# Patient Record
Sex: Female | Born: 2005 | Race: Black or African American | Hispanic: No | Marital: Single | Smoking: Never smoker
Health system: Southern US, Community
[De-identification: ages and names within clinical notes are randomized; demographics above are authoritative.]

## PROBLEM LIST (undated history)

## (undated) ENCOUNTER — Inpatient Hospital Stay (HOSPITAL_COMMUNITY): Payer: Self-pay

## (undated) DIAGNOSIS — L732 Hidradenitis suppurativa: Secondary | ICD-10-CM

## (undated) DIAGNOSIS — F419 Anxiety disorder, unspecified: Secondary | ICD-10-CM

## (undated) HISTORY — PX: NO PAST SURGERIES: SHX2092

---
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
## 2023-04-02 IMAGING — US US BREAST*L* LIMITED INC AXILLA
1 series · 13 of 13 positions shown · non-contrast
Comparison: None.

CLINICAL DATA: 15-year-old female with skin placed lumps/draining
sores along the medial inframammary fold bilaterally. The patient is
currently under the care of a dermatologist.

EXAM:
ULTRASOUND OF THE BILATERAL BREAST

[Series 1: us breast*left* limited inc axilla · 0.06mm/px · 13 of 13 slices shown]
[im 1/13]
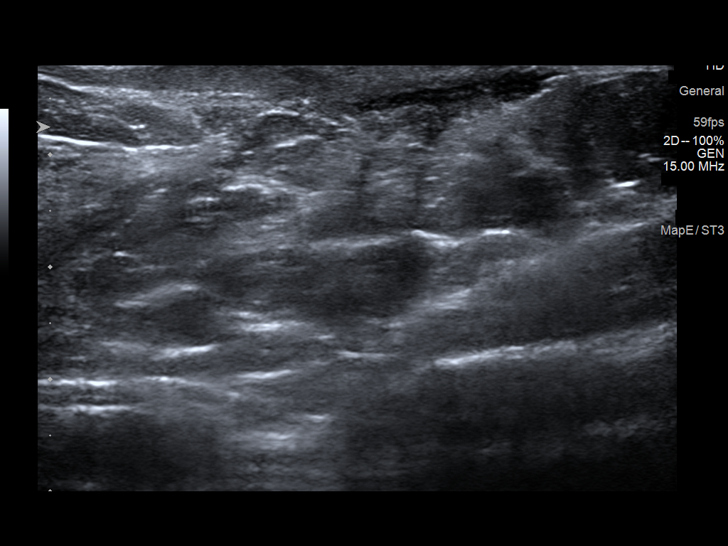
[im 2/13]
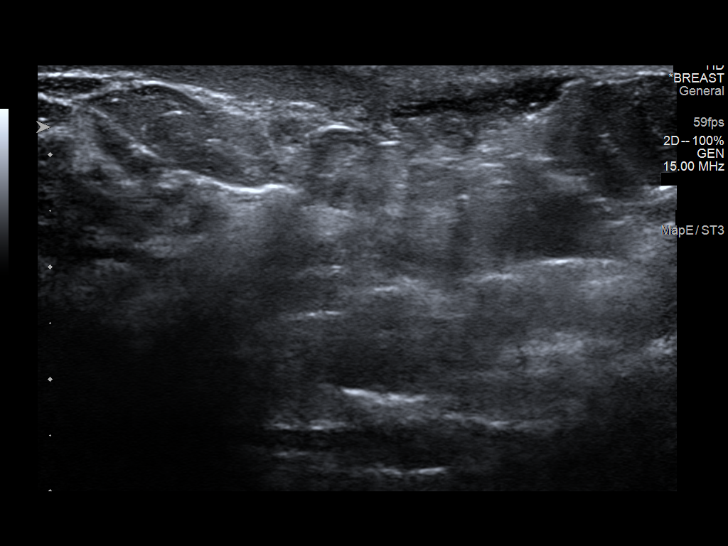
[im 3/13]
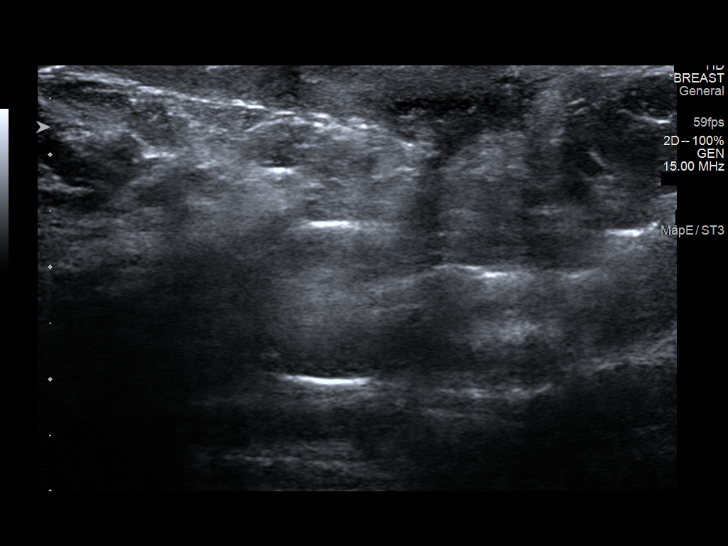
[im 4/13]
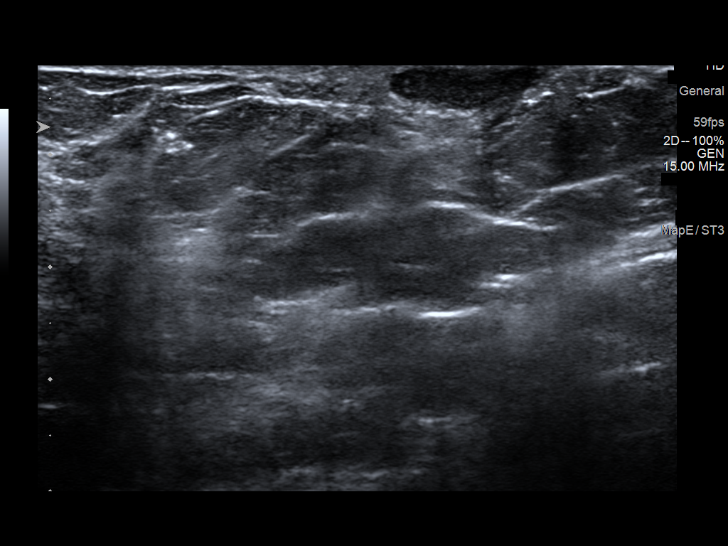
[im 5/13]
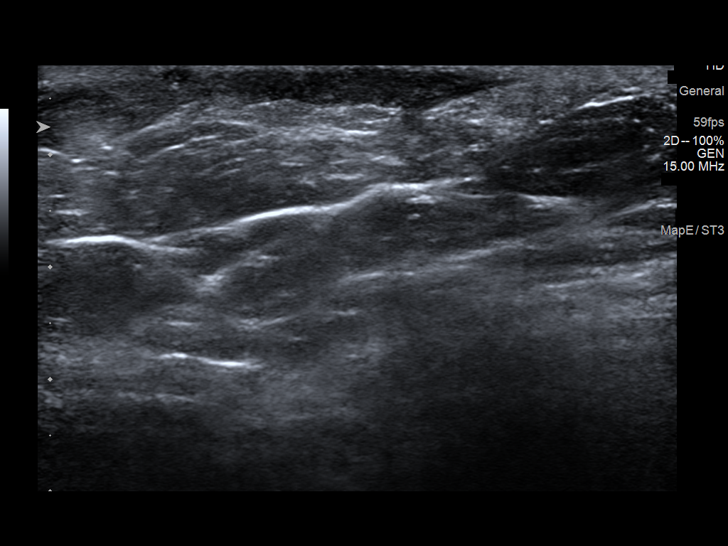
[im 6/13]
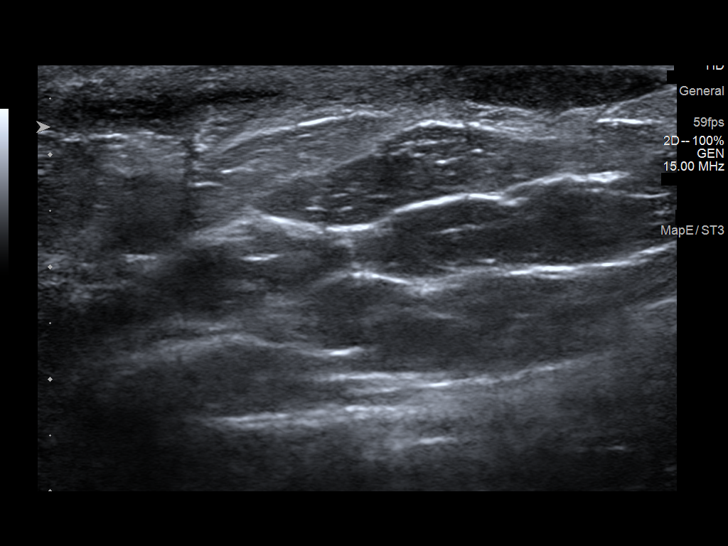
[im 7/13]
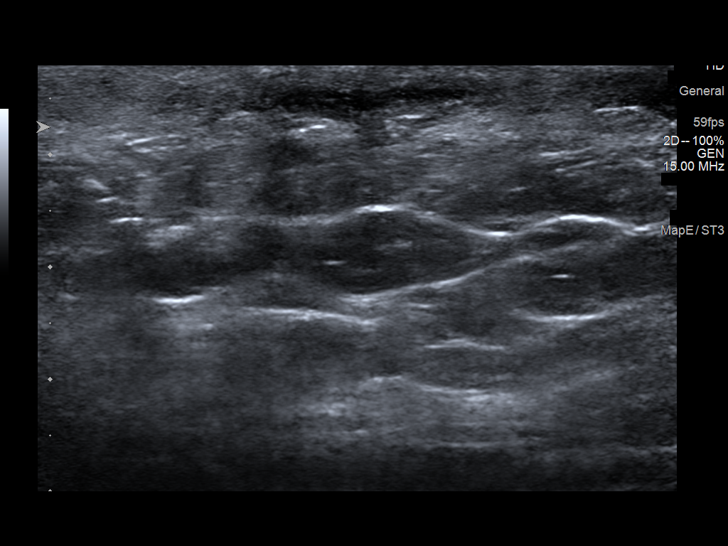
[im 8/13]
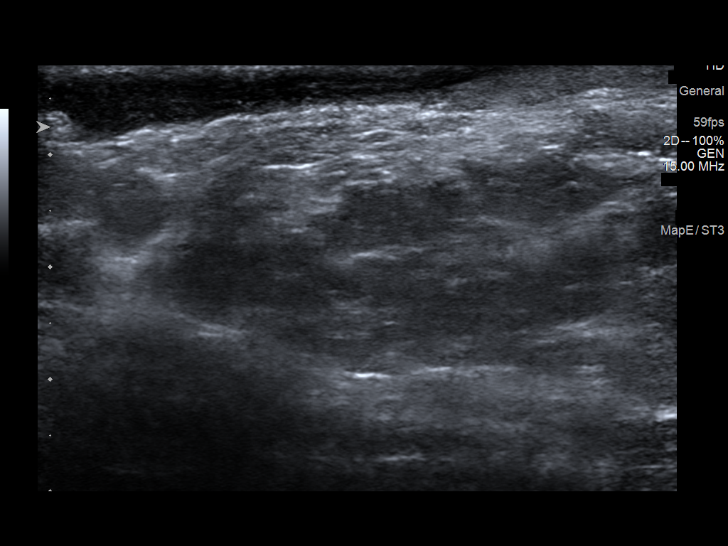
[im 9/13]
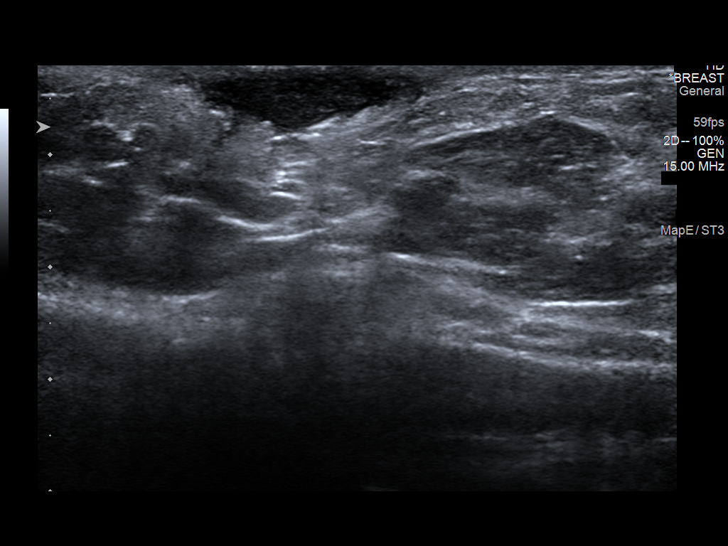
[im 10/13]
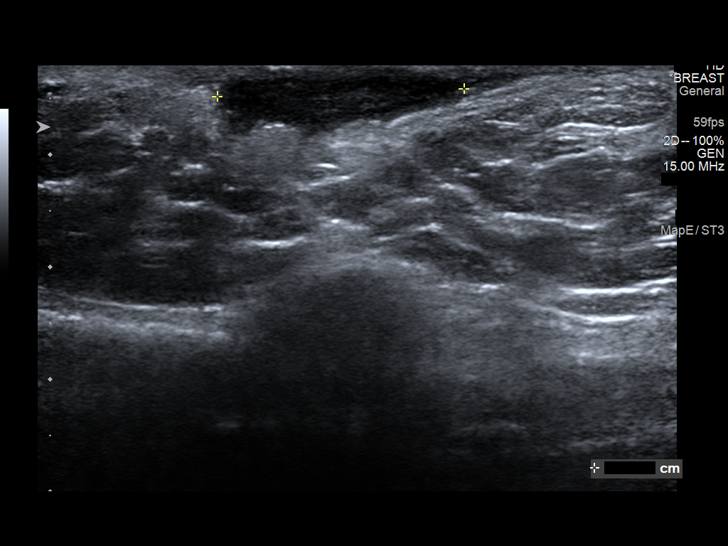
[im 11/13]
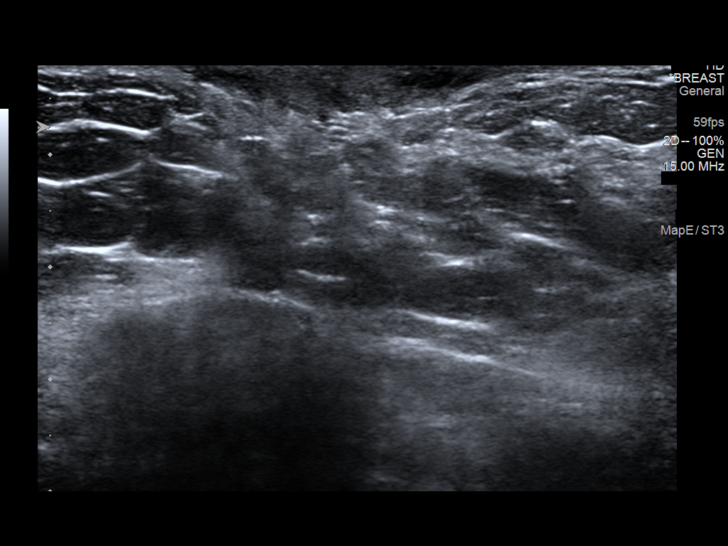
[im 12/13]
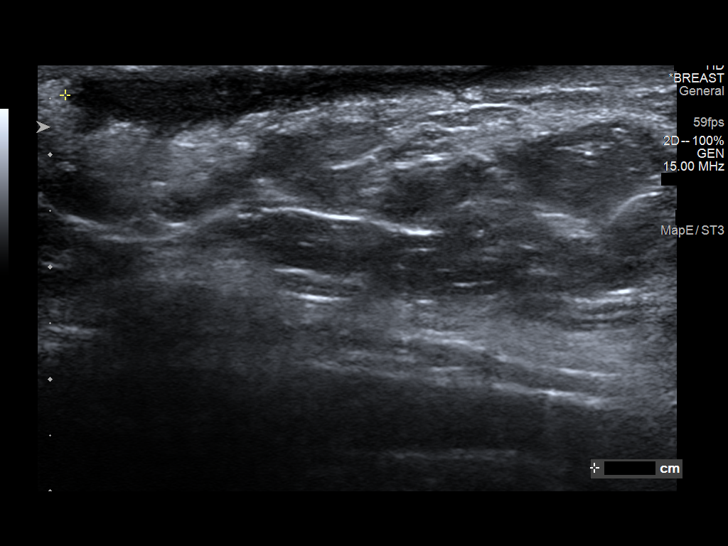
[im 13/13]
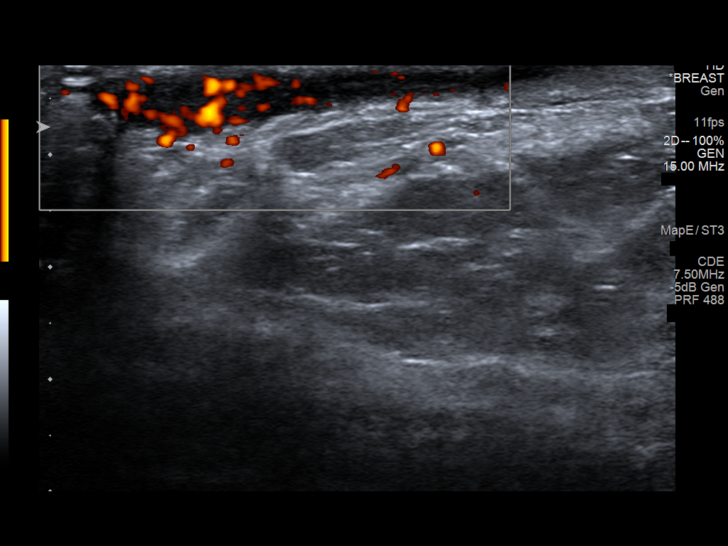

[13 of 13 positions shown; findings below may reference images not displayed]

FINDINGS: On physical exam, open, draining sores are noted along the bilateral
inframammary fold.

Targeted ultrasound is performed, showing circumscribed fluid
collections with associated vascularity along the medial,
inframammary fold bilaterally, right greater than left. These
demonstrate tract to the overlying skin with drainage. No additional
suspicious or deeper findings identified.
IMPRESSION: Bilateral draining fluid/phlegmonous collections within the skin
along the inframammary fold consistent with the patient's clinical
presentation. No additional suspicious or deeper soft tissue
findings identified.

RECOMMENDATION:
1. Recommendation is for clinical and symptomatic follow-up of the
patient's bilateral skin changes.
2. Screening mammogram at age 38 unless there are persistent or
intervening clinical concerns. (Code:UA-B-XJP)

I have discussed the findings and recommendations with the patient.
If applicable, a reminder letter will be sent to the patient
regarding the next appointment.

BI-RADS CATEGORY  2: Benign.

## 2023-07-25 ENCOUNTER — Other Ambulatory Visit: Payer: Self-pay

## 2023-07-25 ENCOUNTER — Encounter (HOSPITAL_COMMUNITY): Payer: Self-pay

## 2023-07-25 ENCOUNTER — Emergency Department (HOSPITAL_COMMUNITY)
Admission: EM | Admit: 2023-07-25 | Discharge: 2023-07-26 | Disposition: A | Discharge status: Other Acute Inpatient Hospital | Payer: Medicaid Other | Attending: Emergency Medicine | Admitting: Emergency Medicine

## 2023-07-25 DIAGNOSIS — T1491XA Suicide attempt, initial encounter: Secondary | ICD-10-CM | POA: Diagnosis not present

## 2023-07-25 DIAGNOSIS — X838XXA Intentional self-harm by other specified means, initial encounter: Secondary | ICD-10-CM | POA: Diagnosis not present

## 2023-07-25 DIAGNOSIS — T50902A Poisoning by unspecified drugs, medicaments and biological substances, intentional self-harm, initial encounter: Secondary | ICD-10-CM

## 2023-07-25 DIAGNOSIS — T40712A Poisoning by cannabis, intentional self-harm, initial encounter: Secondary | ICD-10-CM | POA: Insufficient documentation

## 2023-07-25 DIAGNOSIS — Y900 Blood alcohol level of less than 20 mg/100 ml: Secondary | ICD-10-CM | POA: Insufficient documentation

## 2023-07-25 DIAGNOSIS — F1994 Other psychoactive substance use, unspecified with psychoactive substance-induced mood disorder: Secondary | ICD-10-CM

## 2023-07-25 DIAGNOSIS — F329 Major depressive disorder, single episode, unspecified: Secondary | ICD-10-CM | POA: Insufficient documentation

## 2023-07-25 DIAGNOSIS — T391X2A Poisoning by 4-Aminophenol derivatives, intentional self-harm, initial encounter: Secondary | ICD-10-CM | POA: Diagnosis not present

## 2023-07-25 DIAGNOSIS — F332 Major depressive disorder, recurrent severe without psychotic features: Secondary | ICD-10-CM

## 2023-07-25 LAB — COMPREHENSIVE METABOLIC PANEL
ALT: 17 U/L (ref 0–44)
AST: 18 U/L (ref 15–41)
Albumin: 4.1 g/dL (ref 3.5–5.0)
Alkaline Phosphatase: 82 U/L (ref 38–126)
Anion gap: 7 (ref 5–15)
BUN: 8 mg/dL (ref 6–20)
CO2: 24 mmol/L (ref 22–32)
Calcium: 9.2 mg/dL (ref 8.9–10.3)
Chloride: 105 mmol/L (ref 98–111)
Creatinine, Ser: 0.72 mg/dL (ref 0.44–1.00)
GFR, Estimated: >60 mL/min (ref >60)
Glucose, Bld: 91 mg/dL (ref 70–99)
Potassium: 3.6 mmol/L (ref 3.5–5.1)
Sodium: 136 mmol/L (ref 135–145)
Total Bilirubin: 0.5 mg/dL (ref <1.2)
Total Protein: 8.4 g/dL — ABNORMAL HIGH (ref 6.5–8.1)

## 2023-07-25 LAB — CBC WITH DIFFERENTIAL/PLATELET
Abs Immature Granulocytes: 0.01 10*3/uL (ref 0.00–0.07)
Basophils Absolute: 0 10*3/uL (ref 0.0–0.1)
Basophils Relative: 0 %
Eosinophils Absolute: 0 10*3/uL (ref 0.0–0.5)
Eosinophils Relative: 1 %
HCT: 39.4 % (ref 36.0–46.0)
Hemoglobin: 13.3 g/dL (ref 12.0–15.0)
Immature Granulocytes: 0 %
Lymphocytes Relative: 29 %
Lymphs Abs: 2 10*3/uL (ref 0.7–4.0)
MCH: 29.4 pg (ref 26.0–34.0)
MCHC: 33.8 g/dL (ref 30.0–36.0)
MCV: 87 fL (ref 80.0–100.0)
Monocytes Absolute: 0.4 10*3/uL (ref 0.1–1.0)
Monocytes Relative: 5 %
Neutro Abs: 4.5 10*3/uL (ref 1.7–7.7)
Neutrophils Relative %: 65 %
Platelets: 201 10*3/uL (ref 150–400)
RBC: 4.53 MIL/uL (ref 3.87–5.11)
RDW: 12.6 % (ref 11.5–15.5)
WBC: 7 10*3/uL (ref 4.0–10.5)
nRBC: 0 % (ref 0.0–0.2)

## 2023-07-25 LAB — ETHANOL: Alcohol, Ethyl (B): <10 mg/dL (ref <10)

## 2023-07-25 LAB — ACETAMINOPHEN LEVEL
Acetaminophen (Tylenol), Serum: 52 ug/mL — ABNORMAL HIGH (ref 10–30)
Acetaminophen (Tylenol), Serum: 36 ug/mL — ABNORMAL HIGH (ref 10–30)

## 2023-07-25 LAB — HCG, SERUM, QUALITATIVE: Preg, Serum: NEGATIVE

## 2023-07-25 LAB — SALICYLATE LEVEL: Salicylate Lvl: <7 mg/dL — ABNORMAL LOW (ref 7.0–30.0)

## 2023-07-25 MED ORDER — ONDANSETRON 4 MG PO TBDP: 4.0000 mg | ORAL_TABLET | Freq: Three times a day (TID) | ORAL | Status: DC | PRN

## 2023-07-25 MED ORDER — CHARCOAL ACTIVATED PO LIQD
50.0000 g | Freq: Once | ORAL | Status: AC
  Administered 2023-07-25: 50 g via ORAL
  Filled 2023-07-25: qty 240

## 2023-07-25 NOTE — ED Provider Notes (Signed)
[facility] AT [facility] Provider Note   CSN: [id number] Arrival date & time: 07/25/23  1839     History  Chief Complaint  Patient presents with   Drug Overdose    Sherry Franklin is a 17 y.o. female with no significant past medical history presents to emergency department via EMS for evaluation following a suicide attempt.  She reports that she took 24 500 mg tablets of Tylenol at 1730 today.  She reports that she has had a plan to kill herself for a month.  [place]. She denies HI.   Drug Overdose Pertinent negatives include no chest pain, no abdominal pain, no headaches and no shortness of breath.       Home Medications Prior to Admission medications   Medication Sig Start Date End Date Taking? Authorizing Provider  doxycycline (VIBRAMYCIN) 100 MG capsule Take 1 capsule (100 mg total) by mouth 2 (two) times daily. 10/15/22  Yes Theron Arista, PA-C  naproxen (NAPROSYN) 500 MG tablet Take 1 tablet (500 mg total) by mouth 2 (two) times daily. Patient not taking: Reported on 07/25/2023 06/13/21   Tomi Bamberger, PA-C  ondansetron (ZOFRAN-ODT) 4 MG disintegrating tablet Take 1 tablet (4 mg total) by mouth every 8 (eight) hours as needed for nausea or vomiting. Patient not taking: Reported on 07/25/2023 06/13/21   Tomi Bamberger, PA-C      Allergies    Patient has no known allergies.    Review of Systems   Review of Systems  Constitutional:  Negative for chills, fatigue and fever.  Respiratory:  Negative for cough, chest tightness, shortness of breath and wheezing.   Cardiovascular:  Negative for chest pain and palpitations.  Gastrointestinal:  Negative for abdominal pain, constipation, diarrhea, nausea and vomiting.  Neurological:  Negative for dizziness, seizures, weakness, light-headedness, numbness and headaches.  Psychiatric/Behavioral:  Positive for suicidal ideas.  Negative for hallucinations. The patient is nervous/anxious.     Physical Exam Updated Vital Signs BP 105/76 (BP Location: Right Arm)   Pulse 60   Temp 98.1 F (36.7 C) (Oral)   Resp 14   Ht 5\' 9"  (1.753 m)   Wt 90.7 kg   SpO2 100%   BMI 29.53 kg/m  Physical Exam Vitals and nursing note reviewed.  Constitutional:      General: She is not in acute distress.    Appearance: Normal appearance.  HENT:     Head: Normocephalic and atraumatic.  Eyes:     Conjunctiva/sclera: Conjunctivae normal.  Cardiovascular:     Rate and Rhythm: Normal rate.  Pulmonary:     Effort: Pulmonary effort is normal. No respiratory distress.  Skin:    Coloration: Skin is not jaundiced or pale.  Neurological:     Mental Status: She is alert and oriented to person, place, and time. Mental status is at baseline.     Cranial Nerves: No cranial nerve deficit.     Sensory: No sensory deficit.     Motor: No weakness.     Coordination: Coordination normal.     Gait: Gait normal.     Deep Tendon Reflexes: Reflexes normal.  Psychiatric:     Comments: +SI with a plan Took Tylenol pills today as suicide attempt     ED Results / Procedures / Treatments   Labs (all labs ordered are listed, but only abnormal results are displayed) Labs Reviewed  COMPREHENSIVE METABOLIC PANEL - Abnormal; Notable for the following components:      Result Value   Total Protein 8.4 (*)    All other components within normal limits  ACETAMINOPHEN LEVEL - Abnormal; Notable for the following components:   Acetaminophen (Tylenol), Serum 52 (*)    All other components within normal limits  SALICYLATE LEVEL - Abnormal; Notable for the following components:   Salicylate Lvl <7.0 (*)    All other components within normal limits  ACETAMINOPHEN LEVEL - Abnormal; Notable for the following components:   Acetaminophen (Tylenol), Serum 36 (*)    All other components within normal limits  ETHANOL  CBC WITH DIFFERENTIAL/PLATELET  HCG,  SERUM, QUALITATIVE  RAPID URINE DRUG SCREEN, HOSP PERFORMED    EKG None  Radiology No results found.  Procedures Procedures    Medications Ordered in ED Medications  charcoal activated (NO SORBITOL) (ACTIDOSE-AQUA) suspension 50 g (50 g Oral Given 07/25/23 1937)    ED Course/ Medical Decision Making/ A&P                                 Medical Decision Making Amount and/or Complexity of Data Reviewed Labs: ordered.    Patient presents to the ED for concern of SI and Tylenol ingestion, this involves an extensive number of treatment options, and is a complaint that carries with it a high risk of complications and morbidity.    Co morbidities that complicate the patient evaluation  None   Additional history obtained:  Additional history obtained from EMS, Family, Nursing, and Outside Medical Records   External records from outside source obtained and reviewed    Lab Tests:  I Ordered, and personally interpreted labs.  The pertinent results include:  APAP 2 hours post ingestion is 52. APAP four hours post ingestion 36.     Cardiac Monitoring:  The patient was maintained on a cardiac monitor.  I personally viewed and interpreted the cardiac monitored which showed an underlying rhythm of: NSR with no STE nor ischemia   Medicines ordered and prescription drug management:  I ordered medication including activated charcoal  for tylenol ingestion  Reevaluation of the patient after these medicines showed that the patient improved I have reviewed the patients home medicines and have made adjustments as needed   Consultations Obtained:  I requested consultation with TTS and pending at sign out   Problem List / ED Course:  Suicidal attempt Drug ingestion   Reevaluation:  After the interventions noted above, I reevaluated the patient and found that they have :improved   Dispostion:  Patient resting comfortably in bed. She is tearful and continues to  endorse SI. VS WNL. See HPI.  Upon evaluation, she complains of nausea then had an episode of vomiting a significant amount of pink liquid. She has no complaints of abdominal pain.  Poison control contacted and recommended obtaining APAP level at 4 hours post ingestion and activated charcoal to avoid absorption. APAP 2 hours post ingestion is 52. APAP four hours post ingestion 36. Poison control is contacted and have no further recommendations. TTS consulted for psych management.  TTS pending at sign out to [facility] PA.        Final Clinical Impression(s) / ED Diagnoses Final diagnoses:  Suicide attempt by drug ingestion, initial encounter [facility])    Rx / DC Orders ED Discharge Orders     None  Judithann Sheen, PA 07/25/23 2245    Maia Plan, MD 08/01/23 [phone]

## 2023-07-25 NOTE — ED Triage Notes (Addendum)
Patient BIB [facility] from a car. Ingested 10 grams of tylenol at 5pm. Wanted to kill herself. History of SI attempts. Feeling depressed. Denies pain. Stated "I want to go home." Tearful on arrival.

## 2023-07-25 NOTE — ED Notes (Signed)
Staff gave patient purse to a family member who stated that patient said she can take with her.

## 2023-07-26 ENCOUNTER — Encounter (HOSPITAL_COMMUNITY): Payer: Self-pay | Admitting: Psychiatry

## 2023-07-26 ENCOUNTER — Inpatient Hospital Stay (HOSPITAL_COMMUNITY)
Admission: AD | Admit: 2023-07-26 | Discharge: 2023-07-30 | DRG: 897 | Disposition: A | Discharge status: Home / Self Care | Payer: Medicaid Other | Source: Intra-hospital | Attending: Psychiatry | Admitting: Psychiatry

## 2023-07-26 DIAGNOSIS — F909 Attention-deficit hyperactivity disorder, unspecified type: Secondary | ICD-10-CM | POA: Diagnosis present

## 2023-07-26 DIAGNOSIS — Z818 Family history of other mental and behavioral disorders: Secondary | ICD-10-CM

## 2023-07-26 DIAGNOSIS — Z9151 Personal history of suicidal behavior: Secondary | ICD-10-CM | POA: Diagnosis not present

## 2023-07-26 DIAGNOSIS — F1994 Other psychoactive substance use, unspecified with psychoactive substance-induced mood disorder: Principal | ICD-10-CM | POA: Diagnosis present

## 2023-07-26 DIAGNOSIS — F1290 Cannabis use, unspecified, uncomplicated: Secondary | ICD-10-CM | POA: Diagnosis present

## 2023-07-26 DIAGNOSIS — F332 Major depressive disorder, recurrent severe without psychotic features: Principal | ICD-10-CM | POA: Diagnosis present

## 2023-07-26 DIAGNOSIS — Z91410 Personal history of adult physical and sexual abuse: Secondary | ICD-10-CM

## 2023-07-26 DIAGNOSIS — F411 Generalized anxiety disorder: Secondary | ICD-10-CM | POA: Insufficient documentation

## 2023-07-26 DIAGNOSIS — G4710 Hypersomnia, unspecified: Secondary | ICD-10-CM | POA: Diagnosis present

## 2023-07-26 DIAGNOSIS — Z5941 Food insecurity: Secondary | ICD-10-CM

## 2023-07-26 DIAGNOSIS — F1210 Cannabis abuse, uncomplicated: Secondary | ICD-10-CM | POA: Insufficient documentation

## 2023-07-26 LAB — RAPID URINE DRUG SCREEN, HOSP PERFORMED
Amphetamines: NOT DETECTED
Barbiturates: NOT DETECTED
Benzodiazepines: NOT DETECTED
Cocaine: NOT DETECTED
Opiates: NOT DETECTED
Tetrahydrocannabinol: POSITIVE — AB

## 2023-07-26 MED ORDER — MAGNESIUM HYDROXIDE 400 MG/5ML PO SUSP: 30.0000 mL | Freq: Every day | ORAL | Status: DC | PRN

## 2023-07-26 MED ORDER — TRAZODONE HCL 50 MG PO TABS
50.0000 mg | ORAL_TABLET | Freq: Every evening | ORAL | Status: DC | PRN
  Filled 2023-07-26: qty 1

## 2023-07-26 MED ORDER — HYDROXYZINE HCL 25 MG PO TABS
25.0000 mg | ORAL_TABLET | Freq: Three times a day (TID) | ORAL | Status: DC | PRN
  Filled 2023-07-26: qty 1

## 2023-07-26 MED ORDER — ACETAMINOPHEN 325 MG PO TABS: 650.0000 mg | ORAL_TABLET | Freq: Four times a day (QID) | ORAL | Status: DC | PRN

## 2023-07-26 MED ORDER — HALOPERIDOL 5 MG PO TABS: 5.0000 mg | ORAL_TABLET | Freq: Three times a day (TID) | ORAL | Status: DC | PRN

## 2023-07-26 MED ORDER — ALUM & MAG HYDROXIDE-SIMETH 200-200-20 MG/5ML PO SUSP: 30.0000 mL | ORAL | Status: DC | PRN

## 2023-07-26 MED ORDER — DIPHENHYDRAMINE HCL 25 MG PO CAPS: 50.0000 mg | ORAL_CAPSULE | Freq: Three times a day (TID) | ORAL | Status: DC | PRN

## 2023-07-26 NOTE — Plan of Care (Signed)
  Problem: Education: Goal: Knowledge of [facility] Education information/materials will improve Outcome: Progressing Goal: Verbalization of understanding the information provided will improve Outcome: Progressing   

## 2023-07-26 NOTE — Progress Notes (Signed)
Pt was accepted to [facility] TODAY 07/26/2023; Bed Assignment PENDING discharges   Pt meets inpatient criteria per Mancel Bale, NP   Attending Physician will be Dr. Sarita Bottom, MD   Adult unit: [phone]  Pt can arrive after: [facility] will coordinate with care team.  Care Team notified:  [facility] Zachary George, [facility] [place], Cassandra Hollatz,RN, Chari Manning, Marta Antu, Ashely Johnson,Counselor   [place], [place] 07/26/2023 @ 8:01 AM

## 2023-07-26 NOTE — BH Assessment (Signed)
Comprehensive Clinical Assessment (CCA) Note   07/26/2023 Sherry Franklin [id number]  Disposition: Rockney Ghee, NP recommends inpatient hospitalization.  The patient demonstrates the following risk factors for suicide: Chronic risk factors for suicide include: previous suicide attempts   . Acute risk factors for suicide include: social withdrawal/isolation. Protective factors for this patient include: positive social support. Considering these factors, the overall suicide risk at this point appears to be high. Patient is not appropriate for outpatient follow up.   Per EDP's note: " Pt is a 17 y.o. female with no significant past medical history presents to emergency department via EMS for evaluation following a suicide attempt.  She reports that she took 24 500 mg tablets of Tylenol at 1730 today.  She reports that she has had a plan to kill herself for a month.  [place]. She denies HI. "  Pt was calm and cooperative during the assessment. Pt reports that she has been struggling with depression and suicidal thoughts since August 2024. Pt reports that she became overwhelmed yesterday because she was evicted from her apartment. Pt reports that she has been isolating herself from her family because she wanted to do things on her own. Pt denies HI and AVH. Pt denies outpatient services.Pt is willing to seek outpatient services.   Chief Complaint:  Chief Complaint  Patient presents with   Drug Overdose   Visit Diagnosis:   Suicide Attempt Major Depressive Disorder     CCA Screening, Triage and Referral (STR)  Patient Reported Information How did you hear about [place]? Self  What Is the Reason for Your Visit/Call Today? Per EDP's note: " Pt is a 17 y.o. female with no significant past medical history presents to emergency department via EMS for evaluation following a suicide attempt.  She reports that she took 24 500 mg  tablets of Tylenol at 1730 today.  She reports that she has had a plan to kill herself for a month.  She reports [place] people there. She denies HI. "  How Long Has This Been Causing You Problems? 1-6 months  What Do You Feel Would Help You the Most Today? Treatment for Depression or other mood problem; Stress Management; Medication(s)   Have You Recently Had Any Thoughts About Hurting Yourself? Yes  Are You Planning to Commit Suicide/Harm Yourself At This time? Yes   Flowsheet Row ED from 07/25/2023 in [facility] Emergency Department at [facility] Most recent reading at 07/26/2023  3:31 AM ED from 10/15/2022 in [facility] Emergency Department at [facility] Most recent reading at 10/15/2022  1:37 PM ED from 10/15/2022 in [facility] at [facility]) Most recent reading at 10/15/2022 12:27 PM  C-SSRS RISK CATEGORY High Risk No Risk No Risk       Have you Recently Had Thoughts About Hurting Someone Karolee Ohs? No  Are You Planning to Harm Someone at This Time? No  Explanation: Pt denies HI.   Have You Used Any Alcohol or Drugs in the Past 24 Hours? Yes  What Did You Use and How Much? Marijuana : Unknown amount per pt   Do You Currently Have a Therapist/Psychiatrist? No  Name of Therapist/Psychiatrist: Name of Therapist/Psychiatrist: Denies outpatient services   Have You Been Recently Discharged From Any Office Practice or Programs? No  Explanation of Discharge From Practice/Program:  N/A     CCA Screening Triage Referral Assessment Type of Contact: Tele-Assessment  Telemedicine Service Delivery:   Is this Initial or Reassessment? Is this Initial or Reassessment?: Initial Assessment  Date Telepsych consult ordered in [facility]:  Date Telepsych consult ordered in [facility]: 07/25/23  Time Telepsych consult ordered in [facility]:  Time Telepsych consult ordered in [facility]: 2239  Location of Assessment: [facility]  ED  Provider Location: [facility] Assessment Services   Collateral Involvement: None   Does Patient Have a Automotive engineer Guardian? No  Legal Guardian Contact Information: N/A  Copy of Legal Guardianship Form: -- (N/A)  Legal Guardian Notified of Arrival: -- (N/A)  Legal Guardian Notified of Pending Discharge: -- (N/A)  If Minor and Not Living with Parent(s), Who has Custody? N/A  Is CPS involved or ever been involved? Never  Is APS involved or ever been involved? Never   Patient Determined To Be At Risk for Harm To Self or Others Based on Review of Patient Reported Information or Presenting Complaint? Yes, for Self-Harm  Method: Plan with intent and identified person  Availability of Means: In hand or used  Intent: Clearly intends on inflicting harm that could cause death  Notification Required: No need or identified person  Additional Information for Danger to Others Potential: -- (N/A)  Additional Comments for Danger to Others Potential: N/A  Are There Guns or Other Weapons in Your Home? No  Types of Guns/Weapons: Pt denies access to guns/ weapons  Are These Weapons Safely Secured?                            No  Who Could Verify You Are Able To Have These Secured: Pt denies access to guns/weapons  Do You Have any Outstanding Charges, Pending Court Dates, Parole/Probation? Pt denies pending legal charges  Contacted To Inform of Risk of Harm To Self or Others: -- (n/a)    Does Patient Present under Involuntary Commitment? No    [place] of Residence: [place]   Patient Currently Receiving the Following Services: Not Receiving Services   Determination of Need: Urgent (48 hours)   Options For Referral: Inpatient Hospitalization     CCA Biopsychosocial Patient Reported Schizophrenia/Schizoaffective Diagnosis in Past: No   Strengths: Willingness to seek treatment and self awareness   Mental Health Symptoms Depression:   Change in  energy/activity; Hopelessness; Tearfulness   Duration of Depressive symptoms:  Duration of Depressive Symptoms: Greater than two weeks   Mania:   None   Anxiety:    Sleep; Worrying   Psychosis:   None   Duration of Psychotic symptoms:    Trauma:   None   Obsessions:   None   Compulsions:   None   Inattention:   None   Hyperactivity/Impulsivity:   None   Oppositional/Defiant Behaviors:   None   Emotional Irregularity:   None   Other Mood/Personality Symptoms:   None    Mental Status Exam Appearance and self-care  Stature:   Average   Weight:   Average weight   Clothing:   Casual   Grooming:   Normal   Cosmetic use:   None   Posture/gait:   Normal   Motor activity:   Not Remarkable   Sensorium  Attention:   Normal   Concentration:   Normal   Orientation:   X5   Recall/memory:   Normal   Affect and Mood  Affect:   Depressed  Mood:   Depressed   Relating  Eye contact:   Normal   Facial expression:   Depressed   Attitude toward examiner:   Cooperative   Thought and Language  Speech flow:  Clear and Coherent   Thought content:   Appropriate to Mood and Circumstances   Preoccupation:   None   Hallucinations:   None   Organization:   Coherent   [employer] of Knowledge:   Fair   Intelligence:   Average   Abstraction:   Normal   Judgement:   Good   Reality Testing:   Realistic   Insight:   Good   Decision Making:   Normal   Social Functioning  Social Maturity:   Responsible   Social Judgement:   Normal   Stress  Stressors:   Office manager Ability:   Exhausted; Overwhelmed   Skill Deficits:   Communication; Self-care   Supports:   Family; Friends/Service system     Religion: Religion/Spirituality Are You A Religious Person?: No How Might This Affect Treatment?: n/a  Leisure/Recreation: Leisure / Recreation Do You Have Hobbies?:  No  Exercise/Diet: Exercise/Diet Do You Exercise?: No Have You Gained or Lost A Significant Amount of Weight in the Past Six Months?: No Do You Follow a Special Diet?: No Do You Have Any Trouble Sleeping?: Yes Explanation of Sleeping Difficulties: Pt reports that she is not sleeping alot because of stress   CCA Employment/Education Employment/Work Situation: Employment / Work Situation Employment Situation: Employed Work Stressors: None reported Patient's Job has Been Impacted by Current Illness: No Has Patient ever Been in Equities trader?: No  Education: Education Is Patient Currently Attending School?: No Last Grade Completed: 12 Did You Product manager?: No Did You Have An Individualized Education Program (IIEP): No Did You Have Any Difficulty At [employer]?: No Patient's Education Has Been Impacted by Current Illness: No   CCA Family/Childhood History Family and Relationship History: Family history Marital status: Single Does patient have children?: No  Childhood History:  Childhood History By whom was/is the patient raised?: Mother Did patient suffer any verbal/emotional/physical/sexual abuse as a child?: No Did patient suffer from severe childhood neglect?: No Has patient ever been sexually abused/assaulted/raped as an adolescent or adult?: No Was the patient ever a victim of a crime or a disaster?: No Witnessed domestic violence?: No Has patient been affected by domestic violence as an adult?: No       CCA Substance Use Alcohol/Drug Use: Alcohol / Drug Use Pain Medications: See MAR Prescriptions: See MAR Over the Counter: See MAR History of alcohol / drug use?: Yes Longest period of sobriety (when/how long): N/A Negative Consequences of Use: Financial, Personal relationships Withdrawal Symptoms: None Substance #1 Name of Substance 1: Marijuana 1 - Age of First Use: 16 1 - Amount (size/oz): Per pt, "alot"  Pt reports unknown amount of daily use. 1 -  Frequency: daily 1 - Duration: UTA 1 - Last Use / Amount: Yesterday 07/25/23 1 - Method of Aquiring: UTA 1- Route of Use: UTA                       ASAM's:  Six Dimensions of Multidimensional Assessment  Dimension 1:  Acute Intoxication and/or Withdrawal Potential:      Dimension 2:  Biomedical Conditions and Complications:      Dimension 3:  Emotional, Behavioral, or Cognitive Conditions and Complications:     Dimension 4:  Readiness to Change:  Dimension 5:  Relapse, Continued use, or Continued Problem Potential:     Dimension 6:  Recovery/Living Environment:     ASAM Severity Score:    ASAM Recommended Level of Treatment: ASAM Recommended Level of Treatment: Level I Outpatient Treatment   Substance use Disorder (SUD) Substance Use Disorder (SUD)  Checklist Symptoms of Substance Use: Continued use despite persistent or recurrent social, interpersonal problems, caused or exacerbated by use, Continued use despite having a persistent/recurrent physical/psychological problem caused/exacerbated by use  Recommendations for Services/Supports/Treatments: Recommendations for Services/Supports/Treatments Recommendations For Services/Supports/Treatments: Individual Therapy, Inpatient Hospitalization, Medication Management  Discharge Disposition:    DSM5 Diagnoses: There are no problems to display for this patient.    Referrals to Alternative Service(s): Referred to Alternative Service(s):   Place:   Date:   Time:    Referred to Alternative Service(s):   Place:   Date:   Time:    Referred to Alternative Service(s):   Place:   Date:   Time:    Referred to Alternative Service(s):   Place:   Date:   Time:     Dava Najjar, [place], [facility], NCC
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
## 2023-07-26 NOTE — BHH Group Notes (Signed)
Adult Psychoeducational Group Note  Date:  07/26/2023 Time:  8:27 PM  Group Topic/Focus:  Wrap-Up Group:   The focus of this group is to help patients review their daily goal of treatment and discuss progress on daily workbooks.  Participation Level:  Active  Participation Quality:  Appropriate and Attentive  Affect:  Appropriate  Cognitive:  Alert  Insight: Appropriate  Engagement in Group:  Supportive  Modes of Intervention:  Discussion and Support  Additional Comments:  pt attended AA meeting.  Sherry Franklin 07/26/2023, 8:27 PM

## 2023-07-26 NOTE — Plan of Care (Signed)
  Problem: Education: Goal: Emotional status will improve Outcome: Progressing Goal: Verbalization of understanding the information provided will improve Outcome: Progressing   Problem: Health Behavior/Discharge Planning: Goal: Compliance with treatment plan for underlying cause of condition will improve Outcome: Progressing

## 2023-07-26 NOTE — Tx Team (Signed)
Initial Treatment Plan 07/26/2023 7:03 PM Sherry Franklin ZOX:[id number]    PATIENT STRESSORS: Financial difficulties   Marital or family conflict     PATIENT STRENGTHS: General fund of knowledge    PATIENT IDENTIFIED PROBLEMS:   "OD by taking 24 500mg  tablets of tylenol"    "SI for the past month"    "Depression"    "Evicted from apartment"       DISCHARGE CRITERIA:  Adequate post-discharge living arrangements Improved stabilization in mood, thinking, and/or behavior  PRELIMINARY DISCHARGE PLAN: Outpatient therapy  PATIENT/FAMILY INVOLVEMENT: This treatment plan has been presented to and reviewed with the patient, Sherry Franklin.The patient has been given the opportunity to ask questions and make suggestions.  Earma Reading Elfa Wooton, RN 07/26/2023, 7:03 PM

## 2023-07-27 DIAGNOSIS — F411 Generalized anxiety disorder: Secondary | ICD-10-CM | POA: Insufficient documentation

## 2023-07-27 LAB — TSH: TSH: 0.9 u[IU]/mL (ref 0.350–4.500)

## 2023-07-27 MED ORDER — FLUOXETINE HCL 10 MG PO CAPS
10.0000 mg | ORAL_CAPSULE | Freq: Every day | ORAL | Status: DC
  Administered 2023-07-27 – 2023-07-30 (×4): 10 mg via ORAL
  Filled 2023-07-27 (×7): qty 1

## 2023-07-27 NOTE — Progress Notes (Signed)
   07/27/23 2140  Psych Admission Type (Psych Patients Only)  Admission Status Voluntary  Psychosocial Assessment  Patient Complaints None  Eye Contact Fair  Facial Expression Flat  Affect Anxious  Speech Logical/coherent  Interaction Assertive  Motor Activity Slow  Appearance/Hygiene Unremarkable  Behavior Characteristics Cooperative  Mood Depressed  Thought Process  Coherency WDL  Content WDL  Delusions WDL  Perception WDL  Hallucination None reported or observed  Judgment Impaired  Confusion None  Danger to Self  Current suicidal ideation? Denies  Agreement Not to Harm Self Yes  Description of Agreement verbal  Danger to Others  Danger to Others None reported or observed
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
## 2023-07-27 NOTE — Plan of Care (Signed)
  Problem: Education: Goal: Knowledge of [place] [facility] Education information/materials will improve Outcome: Progressing   
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
## 2023-07-27 NOTE — Group Note (Signed)
Date:  07/27/2023 Time:  11:57 AM  Group Topic/Focus:  Goals Group:   The focus of this group is to help patients establish daily goals to achieve during treatment and discuss how the patient can incorporate goal setting into their daily lives to aide in recovery.    Participation Level:  Active  Deforest Hoyles Roy Snuffer 07/27/2023, 11:57 AM

## 2023-07-27 NOTE — Group Note (Signed)
Date:  07/27/2023 Time:  9:28 PM  Group Topic/Focus:  Wrap-Up Group:   The focus of this group is to help patients review their daily goal of treatment and discuss progress on daily workbooks.    Participation Level:  Active  Participation Quality:  Appropriate and Sharing  Affect:  Appropriate  Cognitive:  Appropriate  Insight: Appropriate  Engagement in Group:  Engaged  Modes of Intervention:  Activity and Socialization  Additional Comments:  Patient stated that she is going and she is having a good day. Patient stated that she colored most of the day and she uses that as a "stress reliever/coping skill". Patient stated that she does not have any goals while here. Patient stated that her "working a lot is the reason why she is here". Patient stated that she had a good visit with her mother today. Patient rated her day 10/10. Patient participated in the group activity after sharing.   Kennieth Francois 07/27/2023, 9:28 PM

## 2023-07-27 NOTE — Progress Notes (Signed)
Calm cooperative Denies SI/HI/AVH Vital signs stable
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
## 2023-07-27 NOTE — Group Note (Unsigned)
Date:  07/27/2023 Time:  12:13 PM  Group Topic/Focus:  Goals Group:   The focus of this group is to help patients establish daily goals to achieve during treatment and discuss how the patient can incorporate goal setting into their daily lives to aide in recovery.     Participation Level:  {BHH PARTICIPATION WJXBJ:47829}  Participation Quality:  {BHH PARTICIPATION QUALITY:22265}  Affect:  {BHH AFFECT:22266}  Cognitive:  {BHH COGNITIVE:22267}  Insight: {BHH Insight2:20797}  Engagement in Group:  {BHH ENGAGEMENT IN FAOZH:08657}  Modes of Intervention:  {BHH MODES OF INTERVENTION:22269}  Additional Comments:  ***  Deforest Hoyles Charlette Hennings 07/27/2023, 12:13 PM

## 2023-07-27 NOTE — Group Note (Signed)
Date:  07/27/2023 Time:  12:38 PM  Group Topic/Focus:  Personal Choices and Values:   The focus of this group is to help patients assess and explore the importance of values in their lives, how their values affect their decisions, how they express their values and what opposes their expression.    Participation Level:  Active  Sherry Franklin 07/27/2023, 12:38 PM

## 2023-07-28 DIAGNOSIS — F1210 Cannabis abuse, uncomplicated: Secondary | ICD-10-CM | POA: Insufficient documentation

## 2023-07-28 NOTE — Plan of Care (Signed)
Nurse discussed anxiety, depression and coping skills with patient.  

## 2023-07-28 NOTE — B[facility] Suicide Risk Assessment (Signed)
[facility] INPATIENT:  Family/Significant Other Suicide Prevention Education  Suicide Prevention Education:  Education Completed; Sherry Franklin,  (mom [phone]) has been identified by the patient as the family member/significant other with whom the patient will be residing, and identified as the person(s) who will aid the patient in the event of a mental health crisis (suicidal ideations/suicide attempt).  With written consent from the patient, the family member/significant other has been provided the following suicide prevention education, prior to the and/or following the discharge of the patient.  The suicide prevention education provided includes the following: Suicide risk factors Suicide prevention and interventions National Suicide Hotline telephone number [facility] assessment telephone number [facility] Emergency Assistance 911 [facility] and/or Residential Mobile Crisis Unit telephone number  Request made of family/significant other to: Remove weapons (e.g., guns, rifles, knives), all items previously/currently identified as safety concern.   Remove drugs/medications (over-the-counter, prescriptions, illicit drugs), all items previously/currently identified as a safety concern.  The family member/significant other verbalizes understanding of the suicide prevention education information provided.  The family member/significant other agrees to remove the items of safety concern listed above.  Sherry Franklin Sherry Franklin 07/28/2023, 4:39 PM

## 2023-07-28 NOTE — Group Note (Signed)
Date:  07/28/2023 Time:  10:20 AM  Group Topic/Focus:  Goals Group:   The focus of this group is to help patients establish daily goals to achieve during treatment and discuss how the patient can incorporate goal setting into their daily lives to aide in recovery. Orientation:   The focus of this group is to educate the patient on the purpose and policies of crisis stabilization and provide a format to answer questions about their admission.  The group details unit policies and expectations of patients while admitted.    Participation Level:  Active  Participation Quality:  Appropriate  Affect:  Appropriate  Cognitive:  Appropriate  Insight: Appropriate  Engagement in Group:  Engaged  Modes of Intervention:  Discussion and Orientation  Additional Comments:    Sherry Franklin Sherry Franklin 07/28/2023, 10:20 AM

## 2023-07-28 NOTE — Progress Notes (Signed)
D:  Patient's self inventory sheet, patient sleeps good, no sleep medication.  Fair appetite, normal energy level, good concentration.  Rated depression and anxiety 1, denied hopeless.  Denied withdrawals.  Denied SI.  Denied physical problems.  Denied physical pain.  Goal is take time for herself.  Plans to be more open with others.  Does have discharge plans. A:  Medications administered per MD orders.  Emotional support and encouragement given patient. R:  Denied SI and HI, contracts for safety.  Denied A/V hallucinations.  Denied pain.  Safety maintained with 15 minute checks.

## 2023-07-28 NOTE — B[facility] Group Notes (Signed)
[facility] Group Notes:  (Nursing/MHT/Case Management/Adjunct)  Date:  07/28/2023  Time: 1330  Type of Therapy:  Psychoeducational Skills  Participation Level:  Active  Participation Quality:  Appropriate  Affect:  Appropriate  Cognitive:  Appropriate  Insight:  Appropriate  Engagement in Group:  Engaged  Modes of Intervention:  Discussion, Education, and Exploration  Summary of Progress/Problems: Psychoeducational group in which patients were asked to identify patterns of negative thinking and how this can impact mental health. Pt attended and was appropriate.   Malva Limes 07/28/2023, 5:36 PM

## 2023-07-28 NOTE — Progress Notes (Signed)
[facility] MD Progress Note  07/28/2023 10:50 AM Sherry Franklin  MRN:  [id number] Subjective:   Sherry Franklin is a 17 yo patient with a self reported PPH of ADHD during childhood, but likely no medication assistance who presented to [facility] 2/2 EMS after OD on tylenol, patient did receive Charcoal in the ED before transfer to [facility].    Overnight patient had no concerns or events  On assessment this AM patient reports that she feels more comfortable and less anxious on the unit and believes the medication is helpful. Patient reports she is feeling less irritable. Patient reports she saw her mom last night and the visit went well and her entire family is working to help her with her eviction situation, so she is a bit less anxious about this. Patient denies SI, HI, and AVH. Patient reports she also spoke with her boyfriend and the conversation went well. Patient reports she slept well last night and is eating better than the last few days. Patient reports she will be going to group and has been able to find some joy in interacting with others on the unit.    Principal Problem: Substance induced mood disorder (HCC) Diagnosis: Principal Problem:   Substance induced mood disorder (HCC) Active Problems:   GAD (generalized anxiety disorder)   Cannabis use disorder, mild, abuse  Total Time spent with patient: 15 minutes  Past Psychiatric History: INPT: denies OPT: denies Therapy: as a young child, dx with ADHD Meds: denies SA: denies prior to this presentation ED: denies binge, purge, or restricting Self-harm: denies  Past Medical History:  Past Medical History:  Diagnosis Date   Hidradenitis suppurativa    History reviewed. No pertinent surgical history. Family History: History reviewed. No pertinent family history. Family Psychiatric  History:  Mom: depression did well on Prozac Sister: MDD was hospitalized at [facility] in the past Social History:  Social History   Substance and Sexual Activity   Alcohol Use Not Currently     Social History   Substance and Sexual Activity  Drug Use Yes   Types: Marijuana    Social History   Socioeconomic History   Marital status: Single    Spouse name: Not on file   Number of children: Not on file   Years of education: Not on file   Highest education level: Not on file  Occupational History   Not on file  Tobacco Use   Smoking status: Never   Smokeless tobacco: Never  Vaping Use   Vaping status: Never Used  Substance and Sexual Activity   Alcohol use: Not Currently   Drug use: Yes    Types: Marijuana   Sexual activity: Yes    Birth control/protection: None  Other Topics Concern   Not on file  Social History Narrative   Not on file   Social Determinants of Health   Financial Resource Strain: Not at Risk (08/07/2022)   Received from [facility] [employer]    Financial Resource Strain: 1  Food Insecurity: Food Insecurity Present (07/27/2023)   Hunger Vital Sign    Worried About Running Out of Food in the Last Year: Often true    Ran Out of Food in the Last Year: Often true  Transportation Needs: No Transportation Needs (07/27/2023)   PRAPARE - Administrator, Civil Service (Medical): No    Lack of Transportation (Non-Medical): No  Physical Activity: At Risk (08/07/2022)   Received from [place], [place]   Physical Activity  Physical Activity: 2  Stress: Not on File (12/28/2021)   Received from [facility], [place]   Stress    Stress: 0  Social Connections: Not at Risk (08/07/2022)   Received from [employer]   Social Connections    Connectedness: 1   Additional Social History:                         Sleep: Good  Appetite:  Good  Current Medications: Current Facility-Administered Medications  Medication Dose Route Frequency Provider Last Rate Last Admin   acetaminophen (TYLENOL) tablet 650 mg  650 mg Oral Q6H PRN Motley-Mangrum, Jadeka A, PMHNP       alum & mag hydroxide-simeth  (MAALOX/MYLANTA) 200-200-20 MG/5ML suspension 30 mL  30 mL Oral Q4H PRN Motley-Mangrum, Jadeka A, PMHNP       haloperidol (HALDOL) tablet 5 mg  5 mg Oral TID PRN Motley-Mangrum, Jadeka A, PMHNP       And   diphenhydrAMINE (BENADRYL) capsule 50 mg  50 mg Oral TID PRN Motley-Mangrum, Jadeka A, PMHNP       FLUoxetine (PROZAC) capsule 10 mg  10 mg Oral Daily Eliseo Gum B, MD   10 mg at 07/28/23 0804   hydrOXYzine (ATARAX) tablet 25 mg  25 mg Oral TID PRN Motley-Mangrum, Jadeka A, PMHNP       magnesium hydroxide (MILK OF MAGNESIA) suspension 30 mL  30 mL Oral Daily PRN Motley-Mangrum, Jadeka A, PMHNP       traZODone (DESYREL) tablet 50 mg  50 mg Oral QHS PRN Motley-Mangrum, Jadeka A, PMHNP        Lab Results:  Results for orders placed or performed during the hospital encounter of 07/26/23 (from the past 48 hour(s))  TSH     Status: None   Collection Time: 07/27/23  6:29 PM  Result Value Ref Range   TSH 0.900 0.350 - 4.500 uIU/mL    Comment: Performed by a 3rd Generation assay with a functional sensitivity of <=0.01 uIU/mL. Performed at [facility], [address], [place], [place] [address]     Blood Alcohol level:  Lab Results  Component Value Date   ETH <10 07/25/2023    Metabolic Disorder Labs: No results found for: "HGBA1C", "MPG" No results found for: "PROLACTIN" No results found for: "CHOL", "TRIG", "HDL", "CHOLHDL", "VLDL", "LDLCALC"  Physical Findings: AIMS:  , ,  ,  ,    CIWA:    COWS:     Musculoskeletal: Strength & Muscle Tone: within normal limits Gait & Station: normal Patient leans: N/A  Psychiatric Specialty Exam:  Presentation  General Appearance:  Appropriate for Environment  Eye Contact: Good  Speech: Clear and Coherent  Speech Volume: Normal  Handedness:No data recorded  Mood and Affect  Mood: Euthymic  Affect: Appropriate   Thought Process  Thought Processes: Coherent  Descriptions of  Associations:Intact  Orientation:Full (Time, Place and Person)  Thought Content:Logical  History of Schizophrenia/Schizoaffective disorder:No  Duration of Psychotic Symptoms:No data recorded Hallucinations:Hallucinations: None  Ideas of Reference:None  Suicidal Thoughts:Suicidal Thoughts: No  Homicidal Thoughts:Homicidal Thoughts: No   Sensorium  Memory: Immediate Good; Recent Good  Judgment: Fair  Insight: Good   Executive Functions  Concentration: Good  Attention Span: Good  Recall: Good  Fund of Knowledge: Good  Language: Good   Psychomotor Activity  Psychomotor Activity: Psychomotor Activity: Normal   Assets  Assets: Communication Skills; Desire for Improvement; Housing; Resilience; Social Support   Sleep  Sleep: Sleep:  Good    Physical Exam: Physical Exam Constitutional:      Appearance: Normal appearance.  HENT:     Head: Normocephalic and atraumatic.  Pulmonary:     Effort: Pulmonary effort is normal.  Neurological:     Mental Status: She is alert and oriented to person, place, and time.    Review of Systems  Psychiatric/Behavioral:  Negative for depression, hallucinations and suicidal ideas. The patient does not have insomnia.    Blood pressure 136/80, pulse 87, temperature 98.2 F (36.8 C), temperature source Oral, resp. rate 18, height 5\' 9"  (1.753 m), weight 84.8 kg, SpO2 100%. Body mass index is 27.62 kg/m.   Treatment Plan Summary: Daily contact with patient to assess and evaluate symptoms and progress in treatment and Medication management  Patient appears to be adjusting well to medication. Will continue at current dose. Patient plans to discharge to mom, but may need to present on Wednesday for her family to help her get her belongings.   Observation Level/Precautions:  15 minute checks  Laboratory:  UDS: + THC, tylenol 52>36, hcg: (-), CBC: WNL, CMP:  WNL, Salicylate: WNL, Etoh: (-), TSH:  WNL  Psychotherapy:     Medications:    Consultations:    Discharge Concerns:    Estimated LOS:  Other:      Physician Treatment Plan for Primary Diagnosis: Substance induced mood disorder (HCC) Long Term Goal(s): Improvement in symptoms so as ready for discharge   Short Term Goals: Ability to identify changes in lifestyle to reduce recurrence of condition will improve, Ability to verbalize feelings will improve, Ability to disclose and discuss suicidal ideas, Ability to demonstrate self-control will improve, Ability to identify and develop effective coping behaviors will improve, Ability to maintain clinical measurements within normal limits will improve, and Ability to identify triggers associated with substance abuse/mental health issues will improve   Physician Treatment Plan for Secondary Diagnosis: Principal Problem:   Substance induced mood disorder (HCC) Active Problems:   GAD (generalized anxiety disorder)   Long Term Goal(s): Improvement in symptoms so as ready for discharge   Short Term Goals: Ability to identify changes in lifestyle to reduce recurrence of condition will improve, Ability to verbalize feelings will improve, Ability to disclose and discuss suicidal ideas, Ability to demonstrate self-control will improve, Ability to identify and develop effective coping behaviors will improve, Ability to maintain clinical measurements within normal limits will improve, and Ability to identify triggers associated with substance abuse/mental health issues will improve     Safety and Monitoring: Involuntary admission to inpatient psychiatric unit for safety, stabilization and treatment Daily contact with patient to assess and evaluate symptoms and progress in treatment Patient's case to be discussed in multi-disciplinary team meeting Observation Level : q15 minute checks Vital signs: q12 hours Precautions: suicide, but pt currently verbally contracts for safety on unit    Discharge Planning: Social  work and case management to assist with discharge planning and identification of hospital follow-up needs prior to discharge Estimated LOS: 5-7 days Discharge Concerns: Need to establish a safety plan; Medication compliance and effectiveness Discharge Goals: Return home with outpatient referrals for mental health follow-up including medication management/psychotherapy.    Dx: GAD SIMD Cannabis use disorder, mild -  Continue Prozac 10mg , black box warning was discussed   PRN -Tylenol 650mg  q6h, pain -Maalox 30ml q4h, indigestion -Atarax 25mg  TID, anxiety -Milk of Mag 30mL, constipation -Trazodone 50mg  QHS, insomnia    Bobbye Morton, MD 07/28/2023, 10:50 AM

## 2023-07-28 NOTE — [facility] Group Notes (Signed)
Type of Therapy and Topic:  Group Therapy: Gratitude  Participation Level:  Active   Description of Group:   In this group, the patient participates in deep breathing exercise to help quiet down the room and memory game to get to know one another. Patients shared and discussed the importance of acknowledging the elements in their lives for which they are grateful and how this can positively impact their mood.  The group discussed how bringing the positive elements of their lives to the forefront of their minds can help bring positive impact to the physical and mental health.  An exercise was done as a group in which a list was made of gratitude items in order to encourage participants to consider other potential positives in their lives.  Therapeutic Goals: Patients will identify one or more item for which they are grateful in each of 6 categories:  people, experiences, things, places, skills, and other. Patients will discuss how it is possible to seek out gratitude in even bad situations. Patients will explore other possible items of gratitude that they could remember.   Summary of Patient Progress:  The patient shared that she is grateful for fur baby (a dog).  Patient's reaction to the group was engaging.  Therapeutic Modalities:   Solution-Focused Therapy Activity

## 2023-07-28 NOTE — [facility] Group Notes (Signed)
The topic for discussion in today's wrap-up group is, "What are you willing to hold yourself accountable for?" In order to preserve a favorable outcome, avoid exhaustion or overthinking, and stay positive and optimistic in their own rehabilitation, the patient attended  group and shared their deep concepts and therapeutic techniques they had learn throughout program.

## 2023-07-28 NOTE — Progress Notes (Signed)
   07/28/23 0650  15 Minute Checks  Location Cafeteria  Visual Appearance Calm  Behavior Composed  Sleep (Behavioral Health Patients Only)  Calculate sleep? (Click Yes once per 24 hr at 0600 safety check) Yes  Documented sleep last 24 hours 7.75

## 2023-07-29 ENCOUNTER — Encounter (HOSPITAL_COMMUNITY): Payer: Self-pay

## 2023-07-29 DIAGNOSIS — F1994 Other psychoactive substance use, unspecified with psychoactive substance-induced mood disorder: Secondary | ICD-10-CM | POA: Diagnosis not present

## 2023-07-29 NOTE — Progress Notes (Signed)
   07/29/23 0758  Psych Admission Type (Psych Patients Only)  Admission Status Voluntary  Psychosocial Assessment  Patient Complaints Anxiety  Eye Contact Fair  Facial Expression Anxious  Affect Appropriate to circumstance  Speech Logical/coherent  Interaction Assertive  Motor Activity Other (Comment) (WDL)  Appearance/Hygiene Unremarkable  Behavior Characteristics Cooperative;Appropriate to situation  Mood Anxious  Thought Process  Coherency WDL  Content WDL  Delusions None reported or observed  Perception WDL  Hallucination None reported or observed  Judgment Impaired  Confusion None  Danger to Self  Current suicidal ideation? Denies  Agreement Not to Harm Self Yes  Description of Agreement Verbal  Danger to Others  Danger to Others None reported or observed

## 2023-07-29 NOTE — [facility] Group Notes (Signed)
Spiritual care group on grief and loss facilitated by Chaplain Dyanne Carrel, [facility]  Group Goal: Support / Education around grief and loss  Members engage in facilitated group support and psycho-social education.  Group Description:  Following introductions and group rules, group members engaged in facilitated group dialogue and support around topic of loss, with particular support around experiences of loss in their lives. Group Identified types of loss (relationships / self / things) and identified patterns, circumstances, and changes that precipitate losses. Reflected on thoughts / feelings around loss, normalized grief responses, and recognized variety in grief experience. Group encouraged individual reflection on safe space and on the coping skills that they are already utilizing.  Group drew on Adlerian / Rogerian and narrative framework  Patient Progress: Sherry Franklin attended group and was present for the first portion.  She did not participate, but demonstrated engagement in the conversation.

## 2023-07-29 NOTE — Progress Notes (Addendum)
[facility] MD Progress Note  07/29/2023 2:01 PM Sherry Franklin  MRN:  [id number]  Reason for admission::   Sherry Franklin is a 17 yo patient with a self reported PPHx of ADHD during childhood, but likely no medication assistance who presented to [facility] after OD on tylenol, patient did receive Charcoal in the ED before transfer to [facility].   Overnight patient had no concerns or incidents  Yesterday the psychiatry team made the following recommendations: Continue Prozac 10mg , black box warning was discussed   Today's assessment note: On assessment today, the pt reports that her mood is euthymic, improved since admission, and stable. Denies feeling down, depressed, or sad.  Reports that anxiety symptoms are at manageable level and she is feeling less irritable.  She reports her mom visited last night and the visit went well and her entire family is working to help her with her eviction situation. Patient denies SI, HI, and AVH. Patient reports she also spoke with her boyfriend and the conversation went well. Patient reports she is going to group and has been able to find some joy in interacting with others on the unit.    Sleep is stable. Appetite is stable and and is eating better than the last few days. Concentration is without complaint.  Energy level is adequate. Denies having any suicidal thoughts. Denies having any suicidal intent and plan.  Denies having any HI.  Denies having psychotic symptoms.   Denies having side effects to current psychiatric medications.   Discussed discharge planning: How to identify the signs of impending crisis, use of internal coping strategies, reaching out to friends and family that can help navigate a crisis, and a list of mental health professionals and agencies to call. Further to follow up on her mental health appointments and her PCP appointments.  Principal Problem: Substance induced mood disorder (HCC) Diagnosis: Principal Problem:   Substance induced  mood disorder (HCC) Active Problems:   GAD (generalized anxiety disorder)   Cannabis use disorder, mild, abuse  Total Time spent with patient: 15 minutes  Past Psychiatric History: INPT: denies OPT: denies Therapy: as a young child, dx with ADHD Meds: denies SA: denies prior to this presentation ED: denies binge, purge, or restricting Self-harm: denies  Past Medical History:  Past Medical History:  Diagnosis Date   Hidradenitis suppurativa    History reviewed. No pertinent surgical history. Family History: History reviewed. No pertinent family history. Family Psychiatric  History:  Mom: depression did well on Prozac Sister: MDD was hospitalized at [facility] in the past Social History:  Social History   Substance and Sexual Activity  Alcohol Use Not Currently     Social History   Substance and Sexual Activity  Drug Use Yes   Types: Marijuana    Social History   Socioeconomic History   Marital status: Single    Spouse name: Not on file   Number of children: Not on file   Years of education: Not on file   Highest education level: Not on file  Occupational History   Not on file  Tobacco Use   Smoking status: Never   Smokeless tobacco: Never  Vaping Use   Vaping status: Never Used  Substance and Sexual Activity   Alcohol use: Not Currently   Drug use: Yes    Types: Marijuana   Sexual activity: Yes    Birth control/protection: None  Other Topics Concern   Not on file  Social History Narrative   Not on file  Social Determinants of Health   Financial Resource Strain: Not at Risk (08/07/2022)   Received from [place], [employer]    Financial Resource Strain: 1  Food Insecurity: Food Insecurity Present (07/27/2023)   Hunger Vital Sign    Worried About Running Out of Food in the Last Year: Often true    Ran Out of Food in the Last Year: Often true  Transportation Needs: No Transportation Needs (07/27/2023)   PRAPARE - Therapist, art (Medical): No    Lack of Transportation (Non-Medical): No  Physical Activity: At Risk (08/07/2022)   Received from [place], [place]   Physical Activity    Physical Activity: 2  Stress: Not on File (12/28/2021)   Received from [facility], [place]   Stress    Stress: 0  Social Connections: Not at Risk (08/07/2022)   Received from [employer] [facility]   Social Connections    Connectedness: 1   Additional Social History:    Sleep: Good  Appetite:  Good  Current Medications: Current Facility-Administered Medications  Medication Dose Route Frequency Provider Last Rate Last Admin   acetaminophen (TYLENOL) tablet 650 mg  650 mg Oral Q6H PRN Motley-Mangrum, Jadeka A, PMHNP       alum & mag hydroxide-simeth (MAALOX/MYLANTA) 200-200-20 MG/5ML suspension 30 mL  30 mL Oral Q4H PRN Motley-Mangrum, Jadeka A, PMHNP       haloperidol (HALDOL) tablet 5 mg  5 mg Oral TID PRN Motley-Mangrum, Jadeka A, PMHNP       And   diphenhydrAMINE (BENADRYL) capsule 50 mg  50 mg Oral TID PRN Motley-Mangrum, Jadeka A, PMHNP       FLUoxetine (PROZAC) capsule 10 mg  10 mg Oral Daily Eliseo Gum B, MD   10 mg at 07/29/23 0758   hydrOXYzine (ATARAX) tablet 25 mg  25 mg Oral TID PRN Motley-Mangrum, Jadeka A, PMHNP       magnesium hydroxide (MILK OF MAGNESIA) suspension 30 mL  30 mL Oral Daily PRN Motley-Mangrum, Jadeka A, PMHNP       traZODone (DESYREL) tablet 50 mg  50 mg Oral QHS PRN Motley-Mangrum, Jadeka A, PMHNP       Lab Results:  Results for orders placed or performed during the hospital encounter of 07/26/23 (from the past 48 hour(s))  TSH     Status: None   Collection Time: 07/27/23  6:29 PM  Result Value Ref Range   TSH 0.900 0.350 - 4.500 uIU/mL    Comment: Performed by a 3rd Generation assay with a functional sensitivity of <=0.01 uIU/mL. Performed at [facility], [address], [place], [place] [address]    Blood Alcohol level:  Lab Results  Component Value Date   ETH <10  07/25/2023   Metabolic Disorder Labs: No results found for: "HGBA1C", "MPG" No results found for: "PROLACTIN" No results found for: "CHOL", "TRIG", "HDL", "CHOLHDL", "VLDL", "LDLCALC"  Physical Findings: AIMS:  , ,  ,  ,    CIWA:    COWS:     Musculoskeletal: Strength & Muscle Tone: within normal limits Gait & Station: normal Patient leans: N/A  Psychiatric Specialty Exam:  Presentation  General Appearance:  Appropriate for Environment; Casual  Eye Contact: Good  Speech: Clear and Coherent  Speech Volume: Normal  Handedness:Right  Mood and Affect  Mood: Euthymic  Affect: Congruent  Thought Process  Thought Processes: Coherent  Descriptions of Associations:Intact  Orientation:Full (Time, Place and Person)  Thought Content:Logical  History of  Schizophrenia/Schizoaffective disorder:No  Duration of Psychotic Symptoms:No data recorded Hallucinations:Hallucinations: None  Ideas of Reference:None  Suicidal Thoughts:Suicidal Thoughts: No  Homicidal Thoughts:Homicidal Thoughts: No  Sensorium  Memory: Immediate Good; Recent Good  Judgment: Fair  Insight: Fair  Executive Functions  Concentration: Good  Attention Span: Good  Recall: Fair  Fund of Knowledge: Fair  Language: Good  Psychomotor Activity  Psychomotor Activity: Psychomotor Activity: Normal  Assets  Assets: Communication Skills; Desire for Improvement; Physical Health; Resilience; Social Support  Sleep  Sleep: Sleep: Fair Number of Hours of Sleep: 5  Physical Exam: Physical Exam Constitutional:      Appearance: Normal appearance.  HENT:     Head: Normocephalic and atraumatic.     Nose: Nose normal.  Eyes:     Extraocular Movements: Extraocular movements intact.  Cardiovascular:     Rate and Rhythm: Tachycardia present.  Pulmonary:     Effort: Pulmonary effort is normal.  Abdominal:     Comments: Deferred  Genitourinary:    Comments:  Deferred Musculoskeletal:        General: Normal range of motion.     Cervical back: Normal range of motion.  Skin:    General: Skin is warm.  Neurological:     Mental Status: She is alert and oriented to person, place, and time.  Psychiatric:        Behavior: Behavior normal.    Review of Systems  Constitutional:  Negative for chills and fever.  HENT:  Negative for sore throat.   Eyes:  Negative for blurred vision.  Respiratory:  Negative for cough, sputum production, shortness of breath and wheezing.   Cardiovascular:  Negative for chest pain and palpitations.  Gastrointestinal:  Negative for heartburn.  Genitourinary:  Negative for dysuria, frequency and urgency.  Musculoskeletal:  Negative for myalgias and neck pain.  Skin:  Negative for itching and rash.  Neurological:  Negative for dizziness, tingling, tremors, sensory change, speech change, focal weakness and headaches.  Endo/Heme/Allergies:        See allergy listing  Psychiatric/Behavioral:  Negative for depression, hallucinations and suicidal ideas. The patient is nervous/anxious. The patient does not have insomnia.    Blood pressure (!) 139/91, pulse (!) 105, temperature 98.2 F (36.8 C), temperature source Oral, resp. rate 18, height 5\' 9"  (1.753 m), weight 84.8 kg, SpO2 98%. Body mass index is 27.62 kg/m.  Treatment Plan Summary: Daily contact with patient to assess and evaluate symptoms and progress in treatment and Medication management  Patient appears to be adjusting well to medication. Will continue at current dose. Patient plans to discharge to mom, but may need to present on Wednesday for her family to help her get her belongings.   Observation Level/Precautions:  15 minute checks  Laboratory:  UDS: + THC, tylenol 52>36, hcg: (-), CBC: WNL, CMP:  WNL, Salicylate: WNL, Etoh: (-), TSH:  WNL  Psychotherapy:    Medications:    Consultations:    Discharge Concerns:    Estimated LOS:  Other:      Physician  Treatment Plan for Primary Diagnosis: Substance induced mood disorder (HCC) Long Term Goal(s): Improvement in symptoms so as ready for discharge   Short Term Goals: Ability to identify changes in lifestyle to reduce recurrence of condition will improve, Ability to verbalize feelings will improve, Ability to disclose and discuss suicidal ideas, Ability to demonstrate self-control will improve, Ability to identify and develop effective coping behaviors will improve, Ability to maintain clinical measurements within normal limits will improve, and  Ability to identify triggers associated with substance abuse/mental health issues will improve   Physician Treatment Plan for Secondary Diagnosis: Principal Problem:   Substance induced mood disorder (HCC) Active Problems:   GAD (generalized anxiety disorder)   Long Term Goal(s): Improvement in symptoms so as ready for discharge   Short Term Goals: Ability to identify changes in lifestyle to reduce recurrence of condition will improve, Ability to verbalize feelings will improve, Ability to disclose and discuss suicidal ideas, Ability to demonstrate self-control will improve, Ability to identify and develop effective coping behaviors will improve, Ability to maintain clinical measurements within normal limits will improve, and Ability to identify triggers associated with substance abuse/mental health issues will improve   Safety and Monitoring: Involuntary admission to inpatient psychiatric unit for safety, stabilization and treatment Daily contact with patient to assess and evaluate symptoms and progress in treatment Patient's case to be discussed in multi-disciplinary team meeting Observation Level : q15 minute checks Vital signs: q12 hours Precautions: suicide, but pt currently verbally contracts for safety on unit    Discharge Planning: Social work and case management to assist with discharge planning and identification of hospital follow-up needs  prior to discharge Estimated LOS: 5-7 days Discharge Concerns: Need to establish a safety plan; Medication compliance and effectiveness Discharge Goals: Return home with outpatient referrals for mental health follow-up including medication management/psychotherapy.    Dx: GAD SIMD Cannabis use disorder, mild -  Continue Prozac 10mg , black box warning was discussed   PRN -Tylenol 650mg  q6h, pain -Maalox 30ml q4h, indigestion -Atarax 25mg  TID, anxiety -Milk of Mag 30mL, constipation -Trazodone 50mg  QHS, insomnia    Cecilie Lowers, FNP 07/29/2023, 2:01 PM Patient ID: Letta Moynahan, female   DOB: May 03, 2006, 17 y.o.   MRN: [id number]

## 2023-07-29 NOTE — Group Note (Signed)
Date:  07/29/2023 Time:  11:00 AM  Group Topic/Focus:  Wellness Toolbox:   The focus of this group is to discuss various aspects of wellness, balancing those aspects and exploring ways to increase the ability to experience wellness.  Patients will create a wellness toolbox for use upon discharge.    Participation Level:  Active  Participation Quality:  Appropriate  Affect:  Appropriate  Cognitive:  Alert  Insight: Appropriate  Engagement in Group:  Engaged  Modes of Intervention:  Discussion  Additional Comments:    Beckie Busing 07/29/2023, 11:00 AM

## 2023-07-29 NOTE — Group Note (Signed)
Recreation Therapy Group Note   Group Topic:Stress Management  Group Date: 07/29/2023 Start Time: 0940 End Time: 0955 Facilitators: Amir Glaus-McCall, LRT,CTRS Location: [address] Dayroom   Group Topic: Stress Management   Goal Area(s) Addresses:  Patient will actively participate in stress management techniques presented during session.  Patient will successfully identify benefit of practicing stress management post d/c.   Behavioral Response: Appropriate  Intervention: Relaxation exercise with ambient sound and script   Group Description: Guided Imagery. LRT provided education, instruction, and demonstration on practice of visualization via guided imagery. Patient was asked to participate in the technique introduced during session. LRT debriefed including topics of mindfulness, stress management and specific scenarios each patient could use these techniques. Patients were given suggestions of ways to access scripts post d/c and encouraged to explore Youtube and other apps available on smartphones, tablets, and computers.  Education: Stress Management, Discharge Planning.   Education Outcome: Acknowledges education   Affect/Mood: Appropriate   Participation Level: Engaged   Participation Quality: Independent   Behavior: Appropriate   Speech/Thought Process: Focused   Insight: Good   Judgement: Good   Modes of Intervention: Script   Patient Response to Interventions:  Engaged   Education Outcome:  In group clarification offered    Clinical Observations/Individualized Feedback: Pt actively engaged in technique. Pt expressed no concerns at end of session.     Plan: Continue to engage patient in RT group sessions 2-3x/week.   Sherry Franklin, LRT,CTRS  07/29/2023 12:34 PM

## 2023-07-29 NOTE — Plan of Care (Signed)
°  Problem: Education: °Goal: Emotional status will improve °Outcome: Progressing °Goal: Mental status will improve °Outcome: Progressing °Goal: Verbalization of understanding the information provided will improve °Outcome: Progressing °  °

## 2023-07-29 NOTE — [facility] Group Notes (Signed)
[facility] Group Notes:  (Nursing/MHT/Case Management/Adjunct)  Date:  07/29/2023  Time:  10:18 PM  Type of Therapy:  Psychoeducational Skills  Participation Level:  Active  Participation Quality:  Appropriate  Affect:  Appropriate  Cognitive:  Appropriate  Insight:  Good  Engagement in Group:  Engaged  Modes of Intervention:  Education  Summary of Progress/Problems: Patient rated her day as a 9 out of 10 and found out today when she will be discharged. She states that she talked more than yesterday and was visited by her grandmother.   Hazle Coca S 07/29/2023, 10:18 PM

## 2023-07-29 NOTE — Group Note (Signed)
Date:  07/29/2023 Time:  9:20 AM  Group Topic/Focus:  Goals Group:   The focus of this group is to help patients establish daily goals to achieve during treatment and discuss how the patient can incorporate goal setting into their daily lives to aide in recovery.    Participation Level:  Active  Participation Quality:  Appropriate  Affect:  Appropriate  Cognitive:  Alert  Insight: Appropriate  Engagement in Group:  Engaged  Modes of Intervention:  Orientation  Additional Comments:    Beckie Busing 07/29/2023, 9:20 AM

## 2023-07-29 NOTE — Progress Notes (Signed)
   07/29/23 0017  Psych Admission Type (Psych Patients Only)  Admission Status Voluntary  Psychosocial Assessment  Patient Complaints Anxiety  Eye Contact Fair  Facial Expression Anxious  Affect Appropriate to circumstance  Speech Logical/coherent  Interaction Assertive  Motor Activity Other (Comment) (WDL)  Appearance/Hygiene Unremarkable  Behavior Characteristics Cooperative  Mood Anxious  Thought Process  Coherency WDL  Content WDL  Delusions None reported or observed  Perception WDL  Hallucination None reported or observed  Judgment Impaired  Confusion None  Danger to Self  Current suicidal ideation? Denies  Agreement Not to Harm Self Yes  Description of Agreement verbal contract for safety  Danger to Others  Danger to Others None reported or observed

## 2023-07-29 NOTE — BH IP Treatment Plan (Signed)
Interdisciplinary Treatment and Diagnostic Plan Update  07/29/2023 Time of Session: 11:10AM Sherry Franklin MRN: [id number]  Principal Diagnosis: Substance induced mood disorder (HCC)  Secondary Diagnoses: Principal Problem:   Substance induced mood disorder (HCC) Active Problems:   GAD (generalized anxiety disorder)   Cannabis use disorder, mild, abuse   Current Medications:  Current Facility-Administered Medications  Medication Dose Route Frequency Provider Last Rate Last Admin   acetaminophen (TYLENOL) tablet 650 mg  650 mg Oral Q6H PRN Motley-Mangrum, Jadeka A, PMHNP       alum & mag hydroxide-simeth (MAALOX/MYLANTA) 200-200-20 MG/5ML suspension 30 mL  30 mL Oral Q4H PRN Motley-Mangrum, Jadeka A, PMHNP       haloperidol (HALDOL) tablet 5 mg  5 mg Oral TID PRN Motley-Mangrum, Jadeka A, PMHNP       And   diphenhydrAMINE (BENADRYL) capsule 50 mg  50 mg Oral TID PRN Motley-Mangrum, Jadeka A, PMHNP       FLUoxetine (PROZAC) capsule 10 mg  10 mg Oral Daily Eliseo Gum B, MD   10 mg at 07/29/23 0758   hydrOXYzine (ATARAX) tablet 25 mg  25 mg Oral TID PRN Motley-Mangrum, Jadeka A, PMHNP       magnesium hydroxide (MILK OF MAGNESIA) suspension 30 mL  30 mL Oral Daily PRN Motley-Mangrum, Jadeka A, PMHNP       traZODone (DESYREL) tablet 50 mg  50 mg Oral QHS PRN Motley-Mangrum, Jadeka A, PMHNP       PTA Medications: Medications Prior to Admission  Medication Sig Dispense Refill Last Dose   doxycycline (VIBRAMYCIN) 100 MG capsule Take 1 capsule (100 mg total) by mouth 2 (two) times daily. 20 capsule 0    naproxen (NAPROSYN) 500 MG tablet Take 1 tablet (500 mg total) by mouth 2 (two) times daily. (Patient not taking: Reported on 07/25/2023) 30 tablet 0    ondansetron (ZOFRAN-ODT) 4 MG disintegrating tablet Take 1 tablet (4 mg total) by mouth every 8 (eight) hours as needed for nausea or vomiting. (Patient not taking: Reported on 07/25/2023) 20 tablet 0     Patient Stressors: Financial  difficulties   Marital or family conflict    Patient Strengths: General fund of knowledge   Treatment Modalities: Medication Management, Group therapy, Case management,  1 to 1 session with clinician, Psychoeducation, Recreational therapy.   Physician Treatment Plan for Primary Diagnosis: Substance induced mood disorder (HCC) Long Term Goal(s): Improvement in symptoms so as ready for discharge   Short Term Goals: Ability to identify changes in lifestyle to reduce recurrence of condition will improve Ability to verbalize feelings will improve Ability to disclose and discuss suicidal ideas Ability to demonstrate self-control will improve Ability to identify and develop effective coping behaviors will improve Ability to maintain clinical measurements within normal limits will improve Ability to identify triggers associated with substance abuse/mental health issues will improve  Medication Management: Evaluate patient's response, side effects, and tolerance of medication regimen.  Therapeutic Interventions: 1 to 1 sessions, Unit Group sessions and Medication administration.  Evaluation of Outcomes: Not Progressing  Physician Treatment Plan for Secondary Diagnosis: Principal Problem:   Substance induced mood disorder (HCC) Active Problems:   GAD (generalized anxiety disorder)   Cannabis use disorder, mild, abuse  Long Term Goal(s): Improvement in symptoms so as ready for discharge   Short Term Goals: Ability to identify changes in lifestyle to reduce recurrence of condition will improve Ability to verbalize feelings will improve Ability to disclose and discuss suicidal ideas Ability to demonstrate self-control will  improve Ability to identify and develop effective coping behaviors will improve Ability to maintain clinical measurements within normal limits will improve Ability to identify triggers associated with substance abuse/mental health issues will improve     Medication  Management: Evaluate patient's response, side effects, and tolerance of medication regimen.  Therapeutic Interventions: 1 to 1 sessions, Unit Group sessions and Medication administration.  Evaluation of Outcomes: Not Progressing   RN Treatment Plan for Primary Diagnosis: Substance induced mood disorder (HCC) Long Term Goal(s): Knowledge of disease and therapeutic regimen to maintain health will improve  Short Term Goals: Ability to remain free from injury will improve, Ability to verbalize frustration and anger appropriately will improve, Ability to demonstrate self-control, Ability to participate in decision making will improve, Ability to verbalize feelings will improve, Ability to disclose and discuss suicidal ideas, Ability to identify and develop effective coping behaviors will improve, and Compliance with prescribed medications will improve  Medication Management: RN will administer medications as ordered by provider, will assess and evaluate patient's response and provide education to patient for prescribed medication. RN will report any adverse and/or side effects to prescribing provider.  Therapeutic Interventions: 1 on 1 counseling sessions, Psychoeducation, Medication administration, Evaluate responses to treatment, Monitor vital signs and CBGs as ordered, Perform/monitor CIWA, COWS, AIMS and Fall Risk screenings as ordered, Perform wound care treatments as ordered.  Evaluation of Outcomes: Not Progressing   LCSW Treatment Plan for Primary Diagnosis: Substance induced mood disorder (HCC) Long Term Goal(s): Safe transition to appropriate next level of care at discharge, Engage patient in therapeutic group addressing interpersonal concerns.  Short Term Goals: Engage patient in aftercare planning with referrals and resources, Increase social support, Increase ability to appropriately verbalize feelings, Increase emotional regulation, Facilitate acceptance of mental health diagnosis and  concerns, Facilitate patient progression through stages of change regarding substance use diagnoses and concerns, Identify triggers associated with mental health/substance abuse issues, and Increase skills for wellness and recovery  Therapeutic Interventions: Assess for all discharge needs, 1 to 1 time with Social worker, Explore available resources and support systems, Assess for adequacy in community support network, Educate family and significant other(s) on suicide prevention, Complete Psychosocial Assessment, Interpersonal group therapy.  Evaluation of Outcomes: Not Progressing   Progress in Treatment: Attending groups: Yes. Participating in groups: Yes. Taking medication as prescribed: Yes. Toleration medication: Yes. Family/Significant other contact made: Yes, individual(s) contacted:  mother Dipali Basquez [phone] , BF Nori Riis [phone] or grandmother Cecile Hearing [phone] Patient understands diagnosis: Yes. Discussing patient identified problems/goals with staff: Yes. Medical problems stabilized or resolved: Yes. Denies suicidal/homicidal ideation: Yes. Issues/concerns per patient self-inventory: No.   New problem(s) identified: No, Describe:  none reported  New Short Term/Long Term Goal(s): medication stabilization, elimination of SI thoughts, development of comprehensive mental wellness plan.    Patient Goals:  "Letting myself get help from my family, stop overworking myself and stop being so independent"  Discharge Plan or Barriers: Patient recently admitted. CSW will continue to follow and assess for appropriate referrals and possible discharge planning.    Reason for Continuation of Hospitalization: Anxiety Medication stabilization Suicidal ideation  Estimated Length of Stay: 5-7 days  Last 3 [place] Suicide Severity Risk Score: Flowsheet Row [facility] ([facility]25/2023 in  [facility] Department at [facility] ED from 10/15/2022 in [facility] Emergency Department at [facility]  C-SSRS RISK CATEGORY High Risk High Risk No Risk  Last PHQ 2/9 Scores:     No data to display          Scribe for Treatment Team: Kathi Der, LCSWA 07/29/2023 1:47 PM

## 2023-07-29 NOTE — Plan of Care (Signed)
  Problem: Activity: Goal: Interest or engagement in activities will improve Outcome: Progressing Goal: Sleeping patterns will improve Outcome: Progressing   

## 2023-07-30 DIAGNOSIS — F1994 Other psychoactive substance use, unspecified with psychoactive substance-induced mood disorder: Secondary | ICD-10-CM | POA: Diagnosis not present

## 2023-07-30 MED ORDER — LORAZEPAM 1 MG PO TABS: 2.0000 mg | ORAL_TABLET | Freq: Three times a day (TID) | ORAL | Status: DC | PRN

## 2023-07-30 MED ORDER — DIPHENHYDRAMINE HCL 50 MG/ML IJ SOLN: 50.0000 mg | Freq: Three times a day (TID) | INTRAMUSCULAR | Status: DC | PRN

## 2023-07-30 MED ORDER — HALOPERIDOL LACTATE 5 MG/ML IJ SOLN: 5.0000 mg | Freq: Three times a day (TID) | INTRAMUSCULAR | Status: DC | PRN

## 2023-07-30 MED ORDER — DIPHENHYDRAMINE HCL 25 MG PO CAPS: 50.0000 mg | ORAL_CAPSULE | Freq: Three times a day (TID) | ORAL | Status: DC | PRN

## 2023-07-30 MED ORDER — HALOPERIDOL 5 MG PO TABS: 5.0000 mg | ORAL_TABLET | Freq: Three times a day (TID) | ORAL | Status: DC | PRN

## 2023-07-30 MED ORDER — FLUOXETINE HCL 10 MG PO CAPS: 10.0000 mg | ORAL_CAPSULE | Freq: Every day | ORAL | 0 refills | Status: DC

## 2023-07-30 MED ORDER — LORAZEPAM 2 MG/ML IJ SOLN: 2.0000 mg | Freq: Three times a day (TID) | INTRAMUSCULAR | Status: DC | PRN

## 2023-07-30 NOTE — Group Note (Signed)
Recreation Therapy Group Note   Group Topic:Animal Assisted Therapy   Group Date: 07/30/2023 Start Time: 5784 End Time: 1030 Facilitators: Nelsie Domino-McCall, LRT,CTRS Location: 300 Hall Dayroom   Animal-Assisted Activity (AAA) Program Checklist/Progress Notes Patient Eligibility Criteria Checklist & Daily Group note for Rec Tx Intervention  AAA/T Program Assumption of Risk Form signed by Patient/ or Parent Legal Guardian Yes  Patient is free of allergies or severe asthma Yes  Patient reports no fear of animals Yes  Patient reports no history of cruelty to animals Yes  Patient understands his/her participation is voluntary Yes  Patient washes hands before animal contact Yes  Patient washes hands after animal contact Yes  Education: Hand Washing, Appropriate Animal Interaction   Education Outcome: Acknowledges education.    Affect/Mood: Appropriate   Participation Level: Engaged   Participation Quality: Independent   Behavior: Appropriate   Speech/Thought Process: Focused   Insight: Good   Judgement: Good   Modes of Intervention: Teaching laboratory technician   Patient Response to Interventions:  Engaged   Education Outcome:  In group clarification offered    Clinical Observations/Individualized Feedback: Patient attended session and interacted appropriately with therapy dog and peers. Patient asked appropriate questions about therapy dog and his training. Patient shared stories about their pets at home with group.      Plan: Continue to engage patient in RT group sessions 2-3x/week.   Sherry Franklin, LRT,CTRS 07/30/2023 1:04 PM
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
## 2023-07-30 NOTE — Progress Notes (Signed)
Patient verbalizes readiness for discharge. All patient belongings returned to patient. Discharge instructions read and discussed with patient (appointments, medications, resources). Patient expressed gratitude for care provided. Patient discharged to lobby at 1726 where her mom was waiting.

## 2023-07-30 NOTE — Group Note (Signed)
Date:  07/30/2023 Time:  9:27 AM  Group Topic/Focus:  Goals Group:   The focus of this group is to help patients establish daily goals to achieve during treatment and discuss how the patient can incorporate goal setting into their daily lives to aide in recovery. Orientation:   The focus of this group is to educate the patient on the purpose and policies of crisis stabilization and provide a format to answer questions about their admission.  The group details unit policies and expectations of patients while admitted.    Participation Level:  Active  Participation Quality:  Appropriate  Affect:  Appropriate  Cognitive:  Appropriate  Insight: Appropriate  Engagement in Group:  Engaged  Modes of Intervention:  Discussion  Additional Comments:    Sherry Franklin 07/30/2023, 9:27 AM

## 2023-07-30 NOTE — Progress Notes (Signed)
   07/29/23 2130  Psych Admission Type (Psych Patients Only)  Admission Status Voluntary  Psychosocial Assessment  Patient Complaints Anxiety  Eye Contact Fair  Facial Expression Anxious  Affect Appropriate to circumstance  Speech Logical/coherent  Interaction Assertive  Motor Activity Other (Comment) (WDL)  Appearance/Hygiene Unremarkable  Behavior Characteristics Cooperative;Appropriate to situation  Mood Anxious  Thought Process  Coherency WDL  Content WDL  Delusions None reported or observed  Perception WDL  Hallucination None reported or observed  Judgment Impaired  Confusion None  Danger to Self  Current suicidal ideation? Denies  Agreement Not to Harm Self Yes  Description of Agreement Verbal  Danger to Others  Danger to Others None reported or observed

## 2023-07-30 NOTE — Progress Notes (Signed)
  [facility] Adult Case Management Discharge Plan :  Will you be returning to the same living situation after discharge:  No. Will move in with her mother, Amarea Fox  At discharge, do you have transportation home?: Yes,  mother Ganiya Liechti will transport Do you have the ability to pay for your medications: Yes,  has insurance  Release of information consent forms completed and in the chart;  Patient's signature needed at discharge.  Patient to Follow up at:  Follow-up Information     [place] Go to.   Specialty: Professional Counselor Why: Please go to this provider for therapy services, during walk in hours for new patients: Monday through Friday, from 9 am to 1 pm. Contact information: [address] [place] [address] [phone]         [facility] Go on 08/06/2023.   Why: You have an appointment for medication management services on 08/06/23 at 2:00 pm.  The appointment will be held in person, but you may switch to Virtual. Contact information: [address] [place] [address] [phone]                 Next level of care provider has access to [facility] Link:no  Safety Planning and Suicide Prevention discussed: Yes,  Aaron Mose, mother     Has patient been referred to the Quitline?: Patient refused referral for treatment  Patient has been referred for addiction treatment: Patient refused referral for treatment.  Marinda Elk, LCSW 07/30/2023, 3:06 PM

## 2023-07-30 NOTE — Group Note (Signed)
[facility] LCSW Group Therapy Note   Group Date: 07/30/2023 Start Time: 1100 End Time: 1200   Type of Therapy and Topic: Group Therapy: Avoiding Self-Sabotaging and Enabling Behaviors  Participation Level: Active  Mood:  Description of Group:  In this group, patients will learn how to identify obstacles, self-sabotaging and enabling behaviors, as well as: what are they, why do we do them and what needs these behaviors meet. Discuss unhealthy relationships and how to have positive healthy boundaries with those that sabotage and enable. Explore aspects of self-sabotage and enabling in yourself and how to limit these self-destructive behaviors in everyday life.   Therapeutic Goals: 1. Patient will identify one obstacle that relates to self-sabotage and enabling behaviors 2. Patient will identify one personal self-sabotaging or enabling behavior they did prior to admission 3. Patient will state a plan to change the above identified behavior 4. Patient will demonstrate ability to communicate their needs through discussion and/or role play.    Summary of Patient Progress:   During today's group therapy session, the patient demonstrated significant progress in their understanding of personal challenges and emotional patterns. They showed good insight into how past experiences and thought processes contribute to current struggles, particularly in areas such as interpersonal relationships and stress management. The patient actively participated in group discussions, reflecting on their behaviors and showing an openness to feedback from peers and the therapist.   Therapeutic Modalities:  Cognitive Behavioral Therapy Person-Centered Therapy Motivational Interviewing    Marinda Elk, LCSW

## 2023-07-30 NOTE — Discharge Summary (Signed)
Physician Discharge Summary Note  Patient:  Sherry Franklin is an 17 y.o., female MRN:  [id number] DOB:  September 01, 2006 Patient phone:  [phone] (home)  Patient address:   [address] [place] [address],   Total Time spent with patient: 30 minutes  Date of Admission:  07/26/2023 Date of Discharge:  07/30/2023  Reason for Admission:  Sherry Franklin is a 17 yo patient with a self reported PPH of ADHD during childhood, but likely no medication assistance who presented to [facility] 2/2 EMS after OD on tylenol, patient did receive Charcoal in the ED before transfer to [facility].    Principal Problem: Substance induced mood disorder (HCC) Discharge Diagnoses: Principal Problem:   Substance induced mood disorder (HCC) Active Problems:   GAD (generalized anxiety disorder)   Cannabis use disorder, mild, abuse  Past Psychiatric History: INPT: denies OPT: denies Therapy: as a young child, dx with ADHD Meds: denies SA: denies prior to this presentation ED: denies binge, purge, or restricting Self-harm: denies  Past Medical History:  Past Medical History:  Diagnosis Date   Hidradenitis suppurativa    History reviewed. No pertinent surgical history. Family History: History reviewed. No pertinent family history. Family Psychiatric  History: See H&P Social History:  Social History   Substance and Sexual Activity  Alcohol Use Not Currently     Social History   Substance and Sexual Activity  Drug Use Yes   Types: Marijuana    Social History   Socioeconomic History   Marital status: Single    Spouse name: Not on file   Number of children: Not on file   Years of education: Not on file   Highest education level: Not on file  Occupational History   Not on file  Tobacco Use   Smoking status: Never   Smokeless tobacco: Never  Vaping Use   Vaping status: Never Used  Substance and Sexual Activity   Alcohol use: Not Currently   Drug use: Yes    Types: Marijuana   Sexual  activity: Yes    Birth control/protection: None  Other Topics Concern   Not on file  Social History Narrative   Not on file   Social Determinants of Health   Financial Resource Strain: Not at Risk (08/07/2022)   Received from [place], [employer]    Financial Resource Strain: 1  Food Insecurity: Food Insecurity Present (07/27/2023)   Hunger Vital Sign    Worried About Running Out of Food in the Last Year: Often true    Ran Out of Food in the Last Year: Often true  Transportation Needs: No Transportation Needs (07/27/2023)   PRAPARE - Administrator, Civil Service (Medical): No    Lack of Transportation (Non-Medical): No  Physical Activity: At Risk (08/07/2022)   Received from [place], [place]   Physical Activity    Physical Activity: 2  Stress: Not on File (12/28/2021)   Received from [facility], [place]   Stress    Stress: 0  Social Connections: Not at Risk (08/07/2022)   Received from [facility]   Social Connections    Connectedness: 1   Hospital Course:  During the patient's hospitalization, patient had extensive initial psychiatric evaluation, and follow-up psychiatric evaluations every day.  Psychiatric diagnoses provided upon initial assessment:   Principal Problem:   Substance induced mood disorder (HCC) Active Problems:   GAD (generalized anxiety disorder)  Patient's psychiatric medications were adjusted on admission:  Start Prozac 10mg , black box warning  was discussed   During the hospitalization, other adjustments were made to the patient's psychiatric medication regimen:  None  Patient's care was discussed during the interdisciplinary team meeting every day during the hospitalization.  The patient denies having side effects to prescribed psychiatric medication.  Gradually, patient started adjusting to milieu. The patient was evaluated each day by a clinical provider to ascertain response to treatment. Improvement was noted by the patient's  report of decreasing symptoms, improved sleep and appetite, affect, medication tolerance, behavior, and participation in unit programming.  Patient was asked each day to complete a self inventory noting mood, mental status, pain, new symptoms, anxiety and concerns.    Symptoms were reported as significantly decreased or resolved completely by discharge.   On day of discharge, the patient reports that their mood is stable. The patient denied having suicidal thoughts for more than 48 hours prior to discharge.  Patient denies having homicidal thoughts.  Patient denies having auditory hallucinations.  Patient denies any visual hallucinations or other symptoms of psychosis. The patient was motivated to continue taking medication with a goal of continued improvement in mental health.   The patient reports their target psychiatric symptoms of substance-induced mood disorder responded well to the psychiatric medications, and the patient reports overall benefit other psychiatric hospitalization. Supportive psychotherapy was provided to the patient. The patient also participated in regular group therapy while hospitalized. Coping skills, problem solving as well as relaxation therapies were also part of the unit programming.  Labs were reviewed with the patient, and abnormal results were discussed with the patient.  The patient is able to verbalize their individual safety plan to this provider.  # It is recommended to the patient to continue psychiatric medications as prescribed, after discharge from the hospital.    # It is recommended to the patient to follow up with your outpatient psychiatric provider and PCP.  # It was discussed with the patient, the impact of alcohol, drugs, tobacco have been there overall psychiatric and medical wellbeing, and total abstinence from substance use was recommended the patient.ed.  # Prescriptions provided or sent directly to preferred pharmacy at discharge. Patient  agreeable to plan. Given opportunity to ask questions. Appears to feel comfortable with discharge.    # In the event of worsening symptoms, the patient is instructed to call the crisis hotline, 911 and or go to the nearest ED for appropriate evaluation and treatment of symptoms. To follow-up with primary care provider for other medical issues, concerns and or health care needs  # Patient was discharged to home with a plan to follow up as noted below.   Physical Findings: AIMS:  , ,  ,  ,    CIWA:    COWS:     Musculoskeletal: Strength & Muscle Tone: within normal limits Gait & Station: normal Patient leans: N/A  Psychiatric Specialty Exam:  Presentation  General Appearance:  Appropriate for Environment; Fairly Groomed; Casual  Eye Contact: Good  Speech: Normal Rate; Clear and Coherent  Speech Volume: Normal  Handedness: Right  Mood and Affect  Mood: Euthymic  Affect: Appropriate; Congruent; Full Range  Thought Process  Thought Processes: Linear  Descriptions of Associations:Intact  Orientation:Full (Time, Place and Person)  Thought Content:Logical  History of Schizophrenia/Schizoaffective disorder:No  Duration of Psychotic Symptoms:No data recorded Hallucinations:Hallucinations: None  Ideas of Reference:None  Suicidal Thoughts:Suicidal Thoughts: No  Homicidal Thoughts:Homicidal Thoughts: No  Sensorium  Memory: Immediate Good; Recent Good; Remote Good  Judgment: Good  Insight: Good  Executive Functions  Concentration: Good  Attention Span: Good  Recall: Good  Fund of Knowledge: Good  Language: Good  Psychomotor Activity  Psychomotor Activity: Psychomotor Activity: Normal  Assets  Assets: Communication Skills; Desire for Improvement; Physical Health; Resilience; Social Support  Sleep  Sleep: Sleep: Fair Number of Hours of Sleep: 5  Physical Exam: Physical Exam Vitals and nursing note reviewed.  Constitutional:       Appearance: She is obese.  HENT:     Head: Normocephalic.     Nose: Nose normal.  Eyes:     Extraocular Movements: Extraocular movements intact.  Cardiovascular:     Rate and Rhythm: Normal rate.     Pulses: Normal pulses.  Pulmonary:     Effort: Pulmonary effort is normal.  Abdominal:     Comments: Deferred  Genitourinary:    Comments: Deferred Musculoskeletal:        General: Normal range of motion.     Cervical back: Normal range of motion.  Skin:    General: Skin is warm.  Neurological:     General: No focal deficit present.     Mental Status: She is alert and oriented to person, place, and time.  Psychiatric:        Mood and Affect: Mood normal.        Behavior: Behavior normal.        Thought Content: Thought content normal.        Judgment: Judgment normal.    Review of Systems  Constitutional:  Negative for chills and fever.  HENT:  Negative for sore throat.   Eyes:  Negative for blurred vision.  Respiratory:  Negative for cough, sputum production, shortness of breath and wheezing.   Cardiovascular:  Negative for chest pain and palpitations.  Gastrointestinal:  Negative for abdominal pain, constipation, diarrhea, heartburn, nausea and vomiting.  Genitourinary:  Negative for dysuria, frequency and urgency.  Musculoskeletal:  Negative for back pain, falls, joint pain, myalgias and neck pain.  Skin:  Negative for itching and rash.  Neurological:  Negative for dizziness, tingling, tremors, sensory change and headaches.  Endo/Heme/Allergies:        See allergy listing  Psychiatric/Behavioral:  The patient is nervous/anxious (Approved with medication) and has insomnia (Improved with medication).    Blood pressure 112/71, pulse 60, temperature 98 F (36.7 C), temperature source Oral, resp. rate 18, height 5\' 9"  (1.753 m), weight 84.8 kg, SpO2 100%. Body mass index is 27.62 kg/m.  Social History   Tobacco Use  Smoking Status Never  Smokeless Tobacco Never    Tobacco Cessation:  N/A, patient does not currently use tobacco products  Blood Alcohol level:  Lab Results  Component Value Date   ETH <10 07/25/2023   Metabolic Disorder Labs:  No results found for: "HGBA1C", "MPG" No results found for: "PROLACTIN" No results found for: "CHOL", "TRIG", "HDL", "CHOLHDL", "VLDL", "LDLCALC"  See Psychiatric Specialty Exam and Suicide Risk Assessment completed by Attending Physician prior to discharge.  Discharge destination:  Home  Is patient on multiple antipsychotic therapies at discharge:  No   Has Patient had three or more failed trials of antipsychotic monotherapy by history:  No  Recommended Plan for Multiple Antipsychotic Therapies: NA  Discharge Instructions     Diet - low sodium heart healthy   Complete by: As directed    Increase activity slowly   Complete by: As directed    Increase activity slowly   Complete by: As directed  Allergies as of 07/30/2023   No Known Allergies      Medication List     STOP taking these medications    doxycycline 100 MG capsule Commonly known as: VIBRAMYCIN   naproxen 500 MG tablet Commonly known as: NAPROSYN   ondansetron 4 MG disintegrating tablet Commonly known as: ZOFRAN-ODT       TAKE these medications      Indication  FLUoxetine 10 MG capsule Commonly known as: PROZAC Take 1 capsule (10 mg total) by mouth daily. Start taking on: July 31, 2023  Indication: Depression        Follow-up Information     [place] Go to.   Specialty: Professional Counselor Why: Please go to this provider for therapy services, during walk in hours for new patients: Monday through Friday, from 9 am to 1 pm. Contact information: [address] [place] [address] [phone]         [facility] Go on 08/06/2023.   Why: You have an appointment for medication management services on 08/06/23 at 2:00 pm.  The appointment will be  held in person, but you may switch to Virtual. Contact information: [address] [place] [address] [phone]                Follow-up recommendations:   Discharge Recommendations:  The patient is being discharged with her family. Patient is to take her discharge medications as ordered.  See follow up above. We recommend that she participates in individual therapy to target uncontrollable agitation and substance abuse.  We recommend that she participates in therapy to target the conflict with her family, to improve communication skills and conflict resolution skills.  patient is to initiate/implement a contingency based behavioral model to address patient's behavior. We recommend that she gets AIMS scale, height, weight, blood pressure, fasting lipid panel, fasting blood sugar in three months from discharge if she's on atypical antipsychotics.  Patient will benefit from monitoring of recurrent suicidal ideation since patient is on antidepressant medication. The patient should abstain from all illicit substances and alcohol. If the patient's symptoms worsen or do not continue to improve or if the patient becomes actively suicidal or homicidal then it is recommended that the patient return to the closest hospital emergency room or call 911 for further evaluation and treatment. National Suicide Prevention Lifeline 1800-SUICIDE or (847)356-8168. Please follow up with your primary medical doctor for all other medical needs.  The patient has been educated on the possible side effects to medications and she/her guardian is to contact a medical professional and inform outpatient provider of any new side effects of medication. She is to take regular diet and activity as tolerated.  Will benefit from moderate daily exercise. Patient was educated about removing/locking any firearms, medications or dangerous products from the home.  Activity:  As tolerated Diet:  Regular  Diet  Signed: Cecilie Lowers, FNP 07/30/2023, 10:56 AM

## 2023-07-30 NOTE — Plan of Care (Signed)
  Problem: Education: Goal: Emotional status will improve Outcome: Progressing Goal: Mental status will improve Outcome: Progressing Goal: Verbalization of understanding the information provided will improve Outcome: Progressing   Problem: Activity: Goal: Sleeping patterns will improve Outcome: Progressing   Problem: Coping: Goal: Ability to verbalize frustrations and anger appropriately will improve Outcome: Progressing Goal: Ability to demonstrate self-control will improve Outcome: Progressing

## 2023-07-30 NOTE — Discharge Instructions (Signed)

## 2023-09-11 NOTE — L&D Delivery Note (Signed)
   Delivery Note:   G1P1001 at [redacted]w[redacted]d  Admitting diagnosis: Pregnancy [Z34.90] Risks:  Patient Active Problem List   Diagnosis Date Noted   Hidradenitis suppurativa 06/25/2024   Pregnancy 06/25/2024   Shoulder dystocia during labor and delivery, delivered 06/25/2024   Mother positive for group B Streptococcus colonization 06/01/2024   Anemia affecting pregnancy in third trimester 04/16/2024   Supervision of other normal pregnancy, antepartum 11/21/2023   Cannabis use disorder, mild, abuse 07/28/2023   GAD (generalized anxiety disorder) 07/27/2023   Substance induced mood disorder (HCC) 07/26/2023     First Stage:  Induction of labor:06/25/24 Onset of labor: 06/25/24 0600 Augmentation: Pitocin ROM: SROM @ 0600 Active labor onset: 0600 Analgesia /Anesthesia/Pain control intrapartum: Epidural  Second Stage:  Complete dilation at 06/25/2024 1701 Onset of pushing at  1715 FHR second stage Cat II. Moderate variability with accels and occasional decels with quick return to baseline.    CNM called to patient bedside, patient Pushing in lithotomy position with CNM and L&D staff support. FOB and Mother of Patient and FOB all present for birth and supportive.   Delivery of a Live born female  Birth Weight:  PENDING  APGAR: 8, 9  Newborn Delivery   Birth date/time: 06/25/2024 17:27:00 Delivery type: Vaginal, Spontaneous    With great maternal pushing efforts fetal head delivered in cephalic presentation, position DOA to and began to restitute to OP position. On the next contraction with great maternal pushing efforts minimal fetal expulsion noted and turtle sign observed Patient laid flat and on the next contraction with maternal pushing efforts CNM was able to grasp posterior axilla and remaining fetal body delivered with ease. Vigorously crying infant placed immediately skin to skin.   Nuchal Cord: No    After several mins of life cord double clamped after cessation of  pulsation, and cut by FOB.  Collection of cord blood for typing completed. Cord blood donation-None Arterial cord blood sample-No   Third Stage:  Placenta spontaneously delivered-Spontaneous intact with 3 vessels. Uterine tone firm bleeding minimal Uterotonics: IV pit bolus initiated  Placenta to L&D for dispo.  Labial;Periurethral;2nd degree laceration identified.  Episiotomy:None Local analgesia: N/A   Repair:1st on maternal right 2nd  degree laceration on maternal left with labial extension. Maternal Left repaired to hemostasis using a 4.0 monocryl. Following repair, Post placental IUD placed (see procedure note).  Est. Blood Loss (mL):317.00  Complications: None   Mom to postpartum.  Baby Girl Ascasa to Couplet care / Skin to Skin.  Delivery Report:   Review the Delivery Report for details.    Chantee Cerino Erven) Emilio, MSN, CNM  [facility]  06/25/24  7:29 PM

## 2023-09-24 IMAGING — CT CT CHEST W/ CM
2 of 3 series · 15 of 36 positions shown, 18 images · IV contrast (Omni 300)
Comparison: None.

CLINICAL DATA: Chest infection.

EXAM:
CT CHEST WITH CONTRAST
TECHNIQUE: Multidetector CT imaging of the chest was performed during
intravenous contrast administration.
CONTRAST:  75mL OMNIPAQUE IOHEXOL 300 MG/ML  SOLN

[Series 3: chest with 2mm st · axial · 0.82mm/px · z∈[+1135,+1407]mm · 12 of 160 slices shown, 15 images]
[im 12/160  mediastinal]
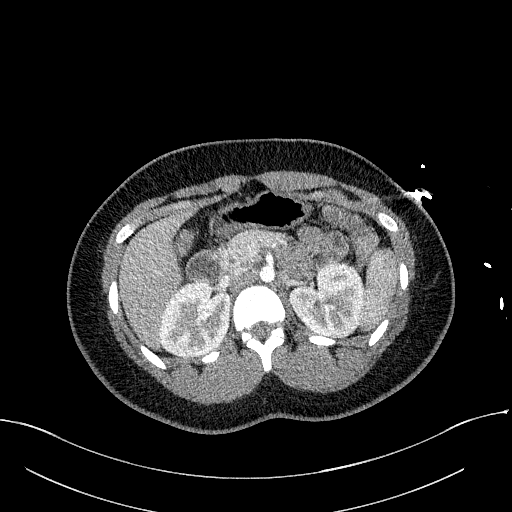
[im 12/160  lung]
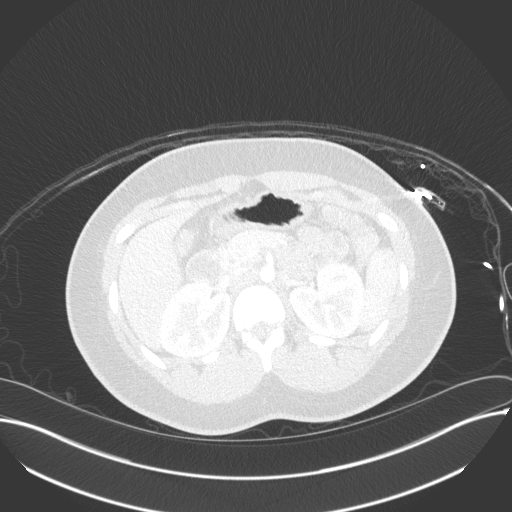
[im 24/160  lung]
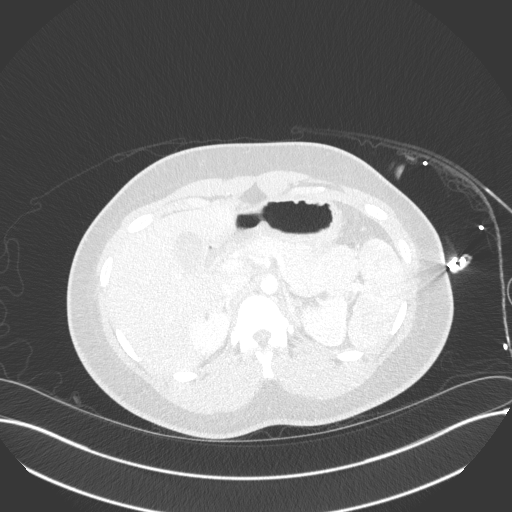
[im 36/160  lung]
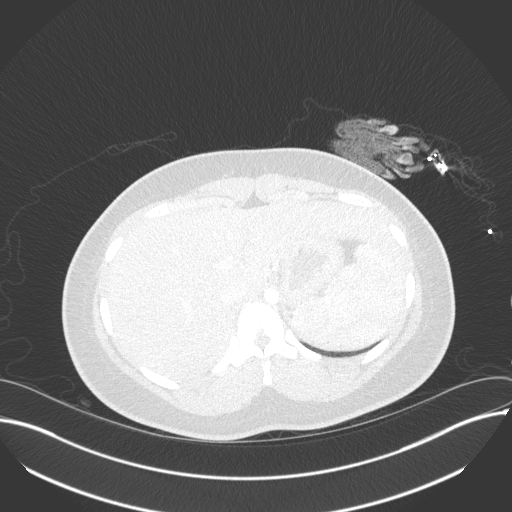
[im 48/160  lung]
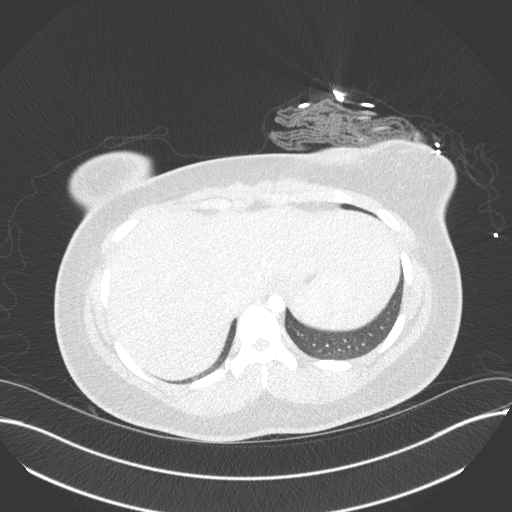
[im 59/160  mediastinal]
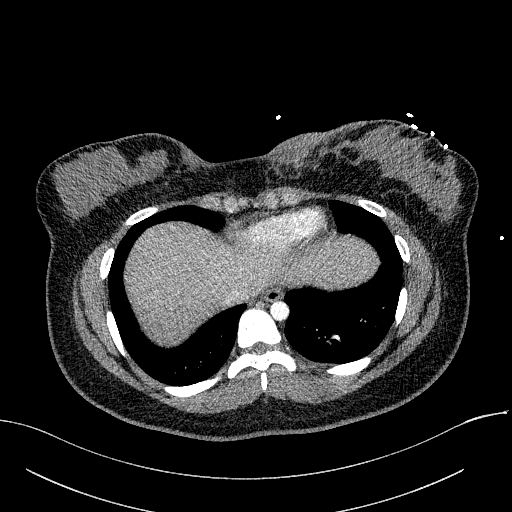
[im 59/160  lung]
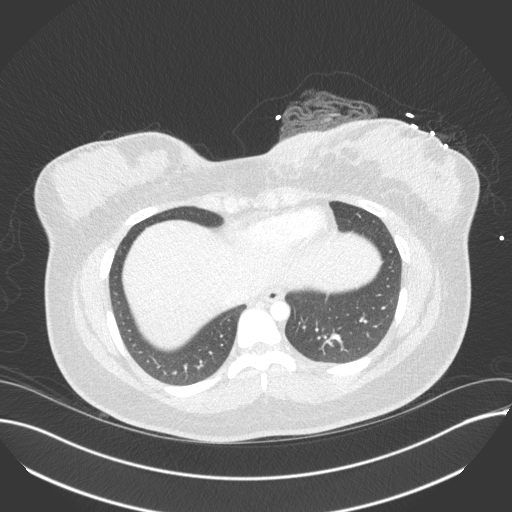
[im 71/160  lung]
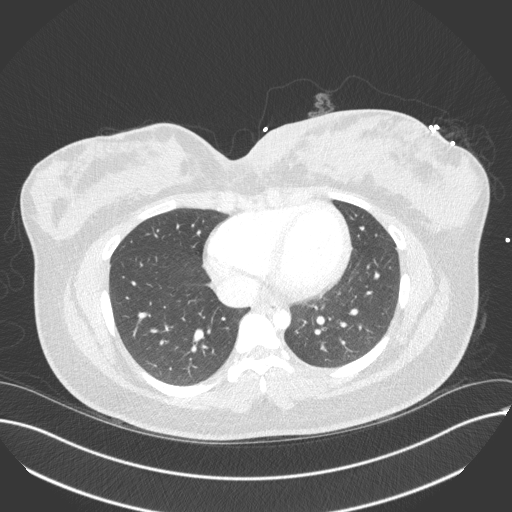
[im 89/160  lung]
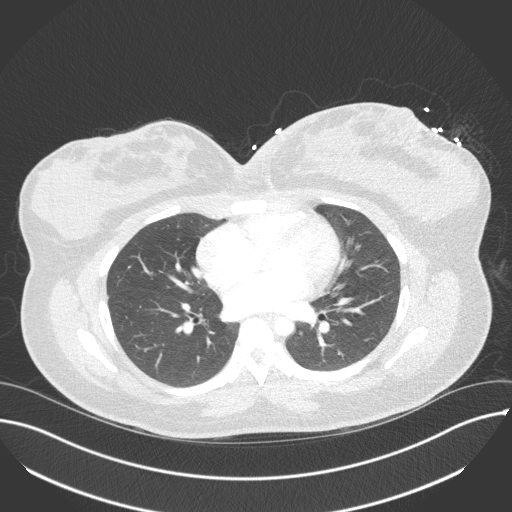
[im 101/160  lung]
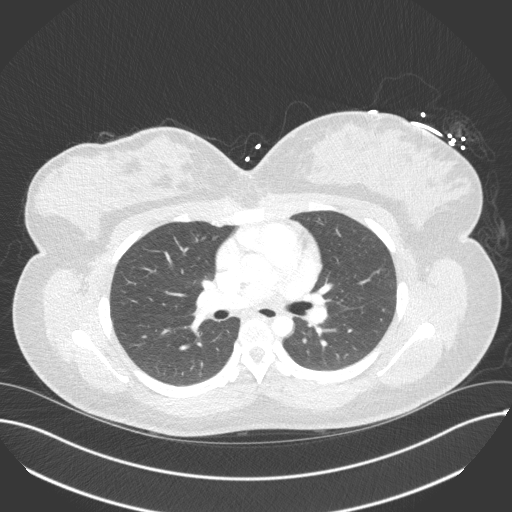
[im 112/160  mediastinal]
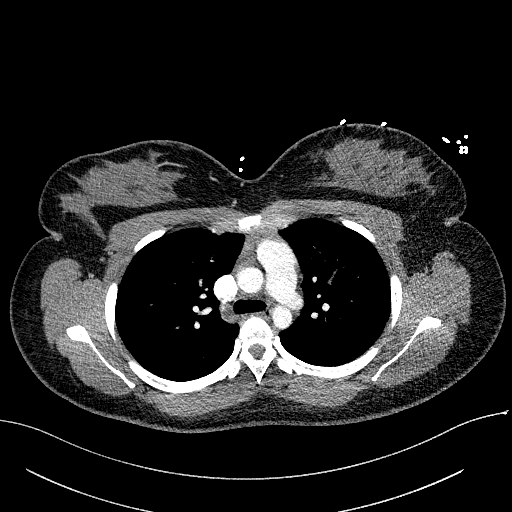
[im 112/160  lung]
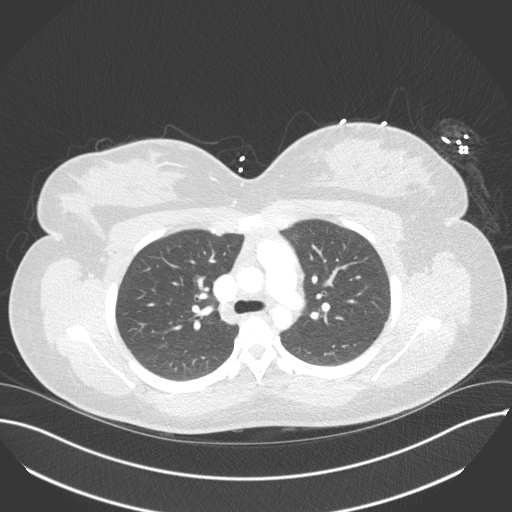
[im 124/160  lung]
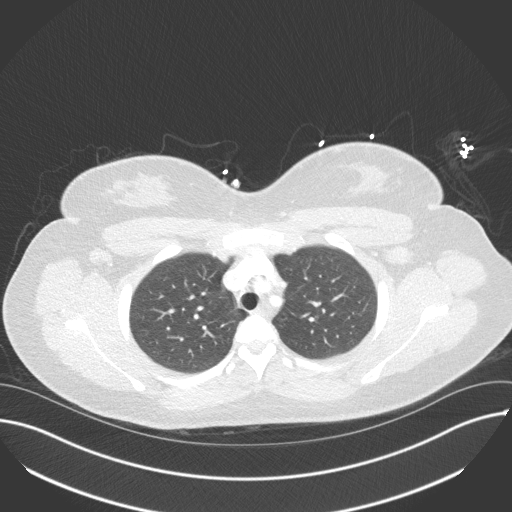
[im 136/160  lung]
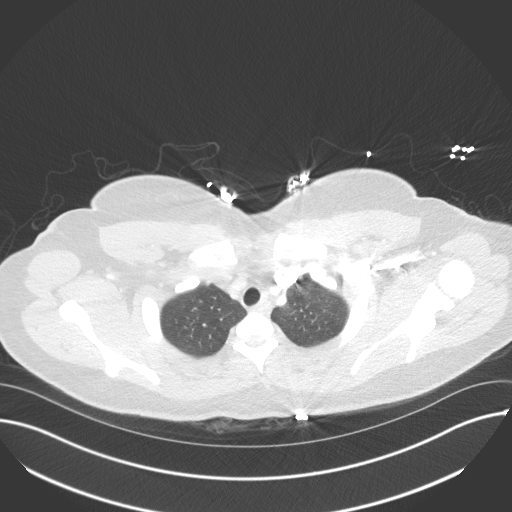
[im 148/160  lung]
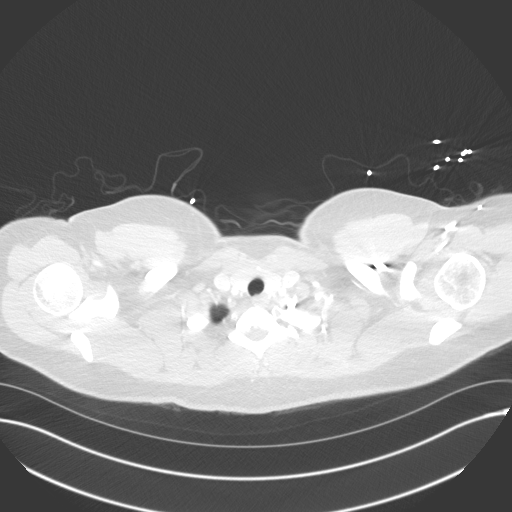

[Series 5: chest with 2mm st cor · coronal · 0.62mm/px · 3 of 150 slices shown]
[im 30/150  lung]
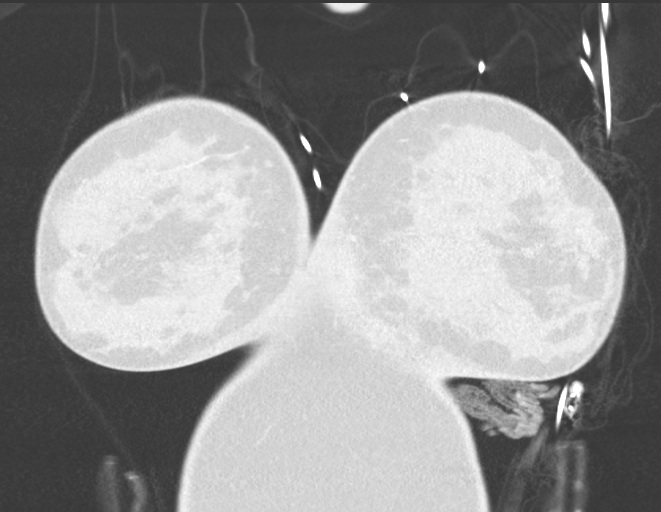
[im 60/150  lung]
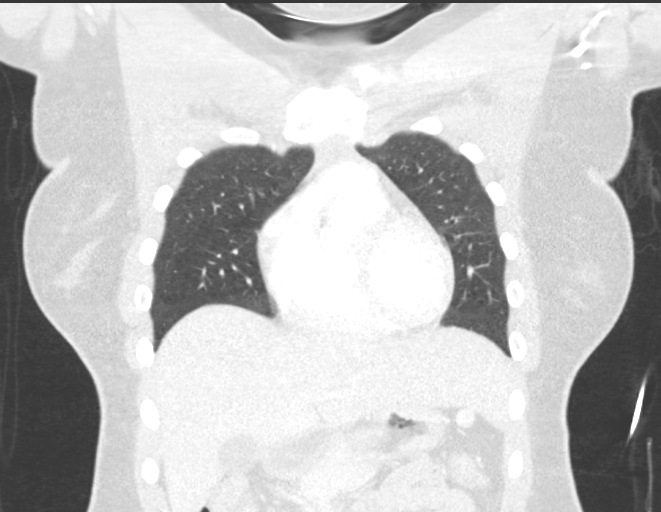
[im 90/150  lung]
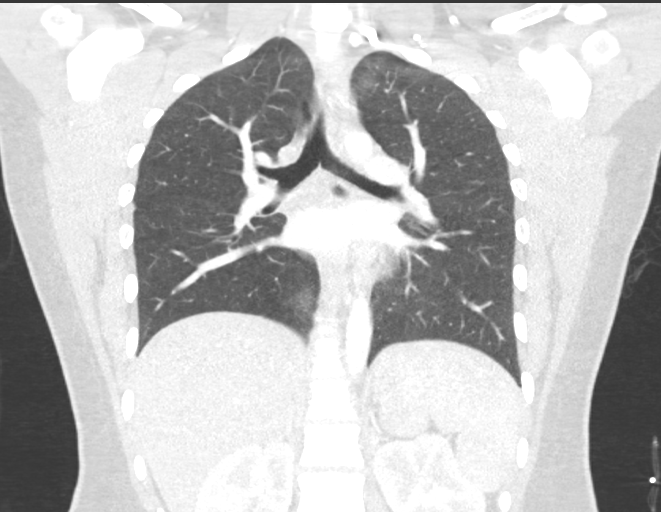

[15 of 36 positions shown; findings below may reference images not displayed]

FINDINGS: Cardiovascular: No significant vascular findings. Normal heart size.
No pericardial effusion.

Mediastinum/Nodes: Thyroid gland and esophagus are unremarkable. No
mediastinal adenopathy is noted. Bilateral axillary adenopathy is
noted, with the largest measuring 2 cm in the left axillary region.

Lungs/Pleura: Lungs are clear. No pleural effusion or pneumothorax.

Upper Abdomen: No acute abnormality.

Musculoskeletal: There is significant subcutaneous thickening of the
inner lower quadrant of the left breast concerning for cellulitis or
other inflammation. 3.8 x 2.2 cm ill-defined low density is seen in
this area and small abscess cannot be excluded. No acute or
significant osseous findings.
IMPRESSION: Significant subcutaneous thickening of the lower inner quadrant of
the left breast is noted concerning for cellulitis or other
inflammation. Ill-defined low density measuring 3.8 x 2.2 cm is seen
in this area which potentially may represent small abscess.
Bilateral axillary adenopathy is noted which is more prominent on
the left, suggesting possible inflammation.

## 2023-10-27 ENCOUNTER — Encounter (HOSPITAL_COMMUNITY): Payer: Self-pay | Admitting: Obstetrics & Gynecology

## 2023-10-27 ENCOUNTER — Other Ambulatory Visit: Payer: Self-pay

## 2023-10-27 ENCOUNTER — Inpatient Hospital Stay (HOSPITAL_COMMUNITY): Payer: Medicaid Other

## 2023-10-27 ENCOUNTER — Inpatient Hospital Stay (HOSPITAL_COMMUNITY)
Admission: AD | Admit: 2023-10-27 | Discharge: 2023-10-27 | Disposition: A | Discharge status: Home / Self Care | Payer: Medicaid Other | Attending: Obstetrics & Gynecology | Admitting: Obstetrics & Gynecology

## 2023-10-27 DIAGNOSIS — O208 Other hemorrhage in early pregnancy: Secondary | ICD-10-CM | POA: Diagnosis not present

## 2023-10-27 DIAGNOSIS — Z3A01 Less than 8 weeks gestation of pregnancy: Secondary | ICD-10-CM | POA: Diagnosis not present

## 2023-10-27 DIAGNOSIS — N8312 Corpus luteum cyst of left ovary: Secondary | ICD-10-CM | POA: Insufficient documentation

## 2023-10-27 DIAGNOSIS — O3481 Maternal care for other abnormalities of pelvic organs, first trimester: Secondary | ICD-10-CM | POA: Diagnosis not present

## 2023-10-27 DIAGNOSIS — O468X1 Other antepartum hemorrhage, first trimester: Secondary | ICD-10-CM

## 2023-10-27 DIAGNOSIS — N760 Acute vaginitis: Secondary | ICD-10-CM | POA: Diagnosis not present

## 2023-10-27 DIAGNOSIS — O26891 Other specified pregnancy related conditions, first trimester: Secondary | ICD-10-CM | POA: Insufficient documentation

## 2023-10-27 DIAGNOSIS — R109 Unspecified abdominal pain: Secondary | ICD-10-CM | POA: Insufficient documentation

## 2023-10-27 DIAGNOSIS — O23591 Infection of other part of genital tract in pregnancy, first trimester: Secondary | ICD-10-CM | POA: Insufficient documentation

## 2023-10-27 DIAGNOSIS — Z3491 Encounter for supervision of normal pregnancy, unspecified, first trimester: Secondary | ICD-10-CM

## 2023-10-27 DIAGNOSIS — B9689 Other specified bacterial agents as the cause of diseases classified elsewhere: Secondary | ICD-10-CM | POA: Insufficient documentation

## 2023-10-27 DIAGNOSIS — O26851 Spotting complicating pregnancy, first trimester: Secondary | ICD-10-CM

## 2023-10-27 DIAGNOSIS — O209 Hemorrhage in early pregnancy, unspecified: Secondary | ICD-10-CM | POA: Diagnosis present

## 2023-10-27 LAB — URINALYSIS, ROUTINE W REFLEX MICROSCOPIC
Bacteria, UA: NONE SEEN
Bilirubin Urine: NEGATIVE
Glucose, UA: NEGATIVE mg/dL
Hgb urine dipstick: NEGATIVE
Ketones, ur: 20 mg/dL — AB
Leukocytes,Ua: NEGATIVE
Nitrite: NEGATIVE
Protein, ur: 30 mg/dL — AB
Specific Gravity, Urine: 1.029 (ref 1.005–1.030)
pH: 5 (ref 5.0–8.0)

## 2023-10-27 LAB — COMPREHENSIVE METABOLIC PANEL WITH GFR
ALT: 20 U/L (ref 0–44)
AST: 21 U/L (ref 15–41)
Albumin: 3.7 g/dL (ref 3.5–5.0)
Alkaline Phosphatase: 64 U/L (ref 38–126)
Anion gap: 9 (ref 5–15)
BUN: 7 mg/dL (ref 6–20)
CO2: 23 mmol/L (ref 22–32)
Calcium: 9.2 mg/dL (ref 8.9–10.3)
Chloride: 102 mmol/L (ref 98–111)
Creatinine, Ser: 0.75 mg/dL (ref 0.44–1.00)
GFR, Estimated: >60 mL/min (ref >60)
Glucose, Bld: 111 mg/dL — ABNORMAL HIGH (ref 70–99)
Potassium: 3.7 mmol/L (ref 3.5–5.1)
Sodium: 134 mmol/L — ABNORMAL LOW (ref 135–145)
Total Bilirubin: 0.8 mg/dL (ref 0.0–1.2)
Total Protein: 8 g/dL (ref 6.5–8.1)

## 2023-10-27 LAB — CBC
HCT: 38.7 % (ref 36.0–46.0)
Hemoglobin: 12.9 g/dL (ref 12.0–15.0)
MCH: 28.7 pg (ref 26.0–34.0)
MCHC: 33.3 g/dL (ref 30.0–36.0)
MCV: 86 fL (ref 80.0–100.0)
Platelets: 224 10*3/uL (ref 150–400)
RBC: 4.5 MIL/uL (ref 3.87–5.11)
RDW: 12.5 % (ref 11.5–15.5)
WBC: 6.5 10*3/uL (ref 4.0–10.5)
nRBC: 0 % (ref 0.0–0.2)

## 2023-10-27 LAB — ABO/RH: ABO/RH(D): B POS

## 2023-10-27 LAB — HCG, QUANTITATIVE, PREGNANCY: hCG, Beta Chain, Quant, S: 20320 m[IU]/mL — ABNORMAL HIGH (ref <5)

## 2023-10-27 LAB — WET PREP, GENITAL
Sperm: NONE SEEN
Trich, Wet Prep: NONE SEEN
WBC, Wet Prep HPF POC: >=10 — AB (ref <10)
Yeast Wet Prep HPF POC: NONE SEEN

## 2023-10-27 MED ORDER — PNV PRENATAL PLUS MULTIVIT+DHA 27-1 & 312 MG PO MISC
1.0000 | Freq: Every day | ORAL | 12 refills | Status: AC
Start: 2023-10-27 — End: ?

## 2023-10-27 MED ORDER — METRONIDAZOLE 500 MG PO TABS: 500.0000 mg | ORAL_TABLET | Freq: Two times a day (BID) | ORAL | 0 refills | Status: DC

## 2023-10-27 NOTE — Discharge Instructions (Addendum)
[facility]   [facility]             [address], [place], [place] [address] [phone]  [facility]                                                             [address], [address], [place], [place], [address] [phone]  [facility]                                    [address], [address], [place], [place], [address] [phone]  [facility] [place] [address], [place], [place], [address] [phone]  [facility]                                 [address], [place], [place], [address] [phone]  [facility]                                    [address], [place], [place], [address] [phone]  [facility] [address], [address], [place], [place], [address]                              [facility] [address], [place], [place], [address] [phone]  [facility]         Phone: [phone]  [facility]      Phone: [phone]   [facility]      Phone: [phone]  [facility]         Phone: [phone]   [facility]    Phone: [phone]  [facility]      Phone: [phone]  [facility]      Phone: [phone]  [facility]        Phone: [phone]  [facility] ([facility]on[facility] Cousins) [phone]  [facility]      Phone: [phone]   [facility]regnancy is a model of prenatal care that started 30 years ago and is used in about 600 practices around the [place]. You meet with a group of 8-12 women due around the  same time as you. In [facility] you will have individual time with the provider and meet as a group. There's much more time for discussion and learning. You will actually have much more time with your provider in [facility] than in traditional prenatal care.? You will come directly into the [facility] room and will not wait in the lobby so there is no wasted time. You will have 2-hour visits every 4 weeks then every 2 weeks. You will know your [facility] prenatal appointments in advance. In your last month of pregnancy, you may also come in for some individual   visits. Additional appointments can be scheduled if you need more care. Studies have shown that [facility]regnancy improves birth outcomes. We have seen especially big improvements in fewer Black women delivering babies who are too small or born too early. Visit the website [facility] for more information. Let your provider or clinic staff know if you want to sign up or email [email or web address] for more information.   [facility]regnancy Video  

## 2023-10-27 NOTE — MAU Provider Note (Signed)
 Chief Complaint: Abdominal Pain and Vaginal Bleeding   None    SUBJECTIVE HPI: Sherry Franklin is a 18 y.o. G1P0 at [redacted]w[redacted]d who presents to Maternity Admissions reporting:  Vaginal Bleeding: *** Passage of tissue or clots: *** Dizziness: ***  B POS  Pain Location: *** Quality: *** Severity: ***/10 on pain scale Duration: *** Course: *** Context: *** Timing: *** Modifying factors: *** Associated signs and symptoms: ***  Past Medical History:  Diagnosis Date  . Hidradenitis suppurativa    OB History  Gravida Para Term Preterm AB Living  1       SAB IAB Ectopic Multiple Live Births          # Outcome Date GA Lbr Len/2nd Weight Sex Type Anes PTL Lv  1 Current            No past surgical history on file. Social History   Socioeconomic History  . Marital status: Single    Spouse name: Not on file  . Number of children: Not on file  . Years of education: Not on file  . Highest education level: Not on file  Occupational History  . Not on file  Tobacco Use  . Smoking status: Never  . Smokeless tobacco: Never  Vaping Use  . Vaping status: Never Used  Substance and Sexual Activity  . Alcohol use: Not Currently  . Drug use: Yes    Types: Marijuana  . Sexual activity: Yes    Birth control/protection: None  Other Topics Concern  . Not on file  Social History Narrative  . Not on file   Social Drivers of Health   Financial Resource Strain: Not on File (08/07/2022)   Received from [employer]   . Financial Resource Strain: 0  Food Insecurity: Food Insecurity Present (07/27/2023)   Hunger Vital Sign   . Worried About Programme researcher, broadcasting/film/video in the Last Year: Often true   . Ran Out of Food in the Last Year: Often true  Transportation Needs: No Transportation Needs (07/27/2023)   PRAPARE - Transportation   . Lack of Transportation (Medical): No   . Lack of Transportation (Non-Medical): No  Physical Activity: Not on File (08/07/2022)   Received  from [facility]   Physical Activity   . Physical Activity: 0  Recent Concern: Physical Activity - At Risk (08/07/2022)   Received from [place], [place]   Physical Activity   . Physical Activity: 2  Stress: Not on File (12/28/2021)   Received from [place], [place]   Stress   . Stress: 0  Social Connections: Not on File (08/07/2022)   Received from [employer]   . Connectedness: 0  Intimate Partner Violence: Not At Risk (07/27/2023)   Humiliation, Afraid, Rape, and Kick questionnaire   . Fear of Current or Ex-Partner: No   . Emotionally Abused: No   . Physically Abused: No   . Sexually Abused: No   No current facility-administered medications on file prior to encounter.   Current Outpatient Medications on File Prior to Encounter  Medication Sig Dispense Refill  . FLUoxetine (PROZAC) 10 MG capsule Take 1 capsule (10 mg total) by mouth daily. 30 capsule 0   No Known Allergies  I have reviewed the past Medical Hx, Surgical Hx, Social Hx, Allergies and Medications.   Review of Systems  OBJECTIVE Patient Vitals for the past 24 hrs:  BP Temp Temp src Pulse Resp Height  10/27/23 1531 137/66 -- -- 64 -- --  10/27/23 1530 -- 98.1 F (36.7 C) Oral -- 17 5\' 8"  (1.727 m)   Constitutional: Well-developed, well-nourished female in no acute distress.  Cardiovascular: normal rate Respiratory: normal rate and effort.  GI: Abd soft, non-tender. Pos BS x 4 MS: Extremities nontender, no edema, normal ROM Neurologic: Alert and oriented x 4.  GU: Neg CVAT.  SPECULUM EXAM: NEFG, physiologic discharge, no blood noted, cervix clean  BIMANUAL: cervix ***; uterus normal size, no adnexal tenderness or masses.  No CMT.  LAB RESULTS Results for orders placed or performed during the hospital encounter of 10/27/23 (from the past 24 hours)  Urinalysis, Routine w reflex microscopic -Urine, Clean Catch     Status: Abnormal   Collection Time: 10/27/23  4:08 PM  Result Value Ref Range   Color,  Urine AMBER (A) YELLOW   APPearance HAZY (A) CLEAR   Specific Gravity, Urine 1.029 1.005 - 1.030   pH 5.0 5.0 - 8.0   Glucose, UA NEGATIVE NEGATIVE mg/dL   Hgb urine dipstick NEGATIVE NEGATIVE   Bilirubin Urine NEGATIVE NEGATIVE   Ketones, ur 20 (A) NEGATIVE mg/dL   Protein, ur 30 (A) NEGATIVE mg/dL   Nitrite NEGATIVE NEGATIVE   Leukocytes,Ua NEGATIVE NEGATIVE   RBC / HPF 0-5 0 - 5 RBC/hpf   WBC, UA 0-5 0 - 5 WBC/hpf   Bacteria, UA NONE SEEN NONE SEEN   Squamous Epithelial / HPF 0-5 0 - 5 /HPF   Mucus PRESENT   Wet prep, genital     Status: Abnormal   Collection Time: 10/27/23  4:08 PM  Result Value Ref Range   Yeast Wet Prep HPF POC NONE SEEN NONE SEEN   Trich, Wet Prep NONE SEEN NONE SEEN   Clue Cells Wet Prep HPF POC PRESENT (A) NONE SEEN   WBC, Wet Prep HPF POC >=10 (A) <10   Sperm NONE SEEN   ABO/Rh     Status: None   Collection Time: 10/27/23  4:12 PM  Result Value Ref Range   ABO/RH(D) B POS    No rh immune globuloin      NOT A RH IMMUNE GLOBULIN CANDIDATE, PT RH POSITIVE Performed at [facility], [address] [place], [place] [address]   CBC     Status: None   Collection Time: 10/27/23  4:12 PM  Result Value Ref Range   WBC 6.5 4.0 - 10.5 K/uL   RBC 4.50 3.87 - 5.11 MIL/uL   Hemoglobin 12.9 12.0 - 15.0 g/dL   HCT 27.2 53.6 - 64.4 %   MCV 86.0 80.0 - 100.0 fL   MCH 28.7 26.0 - 34.0 pg   MCHC 33.3 30.0 - 36.0 g/dL   RDW 03.4 74.2 - 59.5 %   Platelets 224 150 - 400 K/uL   nRBC 0.0 0.0 - 0.2 %  Comprehensive metabolic panel     Status: Abnormal   Collection Time: 10/27/23  4:12 PM  Result Value Ref Range   Sodium 134 (L) 135 - 145 mmol/L   Potassium 3.7 3.5 - 5.1 mmol/L   Chloride 102 98 - 111 mmol/L   CO2 23 22 - 32 mmol/L   Glucose, Bld 111 (H) 70 - 99 mg/dL   BUN 7 6 - 20 mg/dL   Creatinine, Ser 6.38 0.44 - 1.00 mg/dL   Calcium 9.2 8.9 - 75.6 mg/dL   Total Protein 8.0 6.5 - 8.1 g/dL   Albumin 3.7 3.5 - 5.0 g/dL   AST 21 15 - 41  U/L   ALT 20  0 - 44 U/L   Alkaline Phosphatase 64 38 - 126 U/L   Total Bilirubin 0.8 0.0 - 1.2 mg/dL   GFR, Estimated >27 >25 mL/min   Anion gap 9 5 - 15  hCG, quantitative, pregnancy     Status: Abnormal   Collection Time: 10/27/23  4:12 PM  Result Value Ref Range   hCG, Beta Chain, Quant, S 20,320 (H) <5 mIU/mL    IMAGING US OB LESS THAN 14 WEEKS WITH OB TRANSVAGINAL Result Date: 10/27/2023 CLINICAL DATA:  Initial evaluation for acute vaginal bleeding, early pregnancy. EXAM: OBSTETRIC <14 WK [place] AND TRANSVAGINAL OB US TECHNIQUE: Both transabdominal and transvaginal ultrasound examinations were performed for complete evaluation of the gestation as well as the maternal uterus, adnexal regions, and pelvic cul-de-sac. Transvaginal technique was performed to assess early pregnancy. COMPARISON:  None Available. FINDINGS: Intrauterine gestational sac: Single Yolk sac:  Present Embryo:  Present Cardiac Activity: Present Heart Rate: Cardiac activity is visualized on cine clip imaging, however, exact heart rate was unable to be determined on M mode imaging due to small gestational size. CRL:  2.9 mm   5 w   6 d                  [place] EDC: 06/22/2024 Subchorionic hemorrhage: Small subchorionic hemorrhage measuring 0.6 x 0.2 x 1.3 cm without significant mass effect. Maternal uterus/adnexae: Right ovary within normal limits. 2 cm left ovarian corpus luteal cyst noted. No other adnexal mass. Trace free fluid present within the pelvis. IMPRESSION: 1. Single viable IUP, estimated gestational age [redacted] weeks and 6 days by crown-rump length, with ultrasound EDC of 06/22/2024. 2. Small subchorionic hemorrhage as above. 3. 2 cm left ovarian corpus luteal cyst with associated trace free fluid within the pelvis. Electronically Signed   By: Rise Mu M.D.   On: 10/27/2023 18:56    MAU COURSE CBC, Quant, ABO/Rh, ultrasound, wet prep and GC/chlamydia culture, UA  MDM Pain and bleeding in early pregnancy with normal intrauterine  pregnancy and hemodynamically stable.  Pain and bleeding in early pregnancy with pregnancy of unknown anatomic location, but hemodynamically stable.  Plain and bleeding in early pregnancy with pregnancy of unknown anatomic location and hemodynamically unstable.  ASSESSMENT 1. Normal IUP (intrauterine pregnancy) on prenatal ultrasound, first trimester   2. Abdominal pain during pregnancy in first trimester   3. Spotting affecting pregnancy in first trimester   4. BV (bacterial vaginosis)     PLAN Discharge home in stable condition. *** precautions  Follow-up Information     Obstetrician of your choice Follow up.   Why: Start prenatal care        [facility] Follow up.   Specialty: Obstetrics and Gynecology Why: As needed in emergencies Contact information: [address] [place] [address] [phone]               Allergies as of 10/27/2023   No Known Allergies      Medication List     TAKE these medications    FLUoxetine 10 MG capsule Commonly known as: PROZAC Take 1 capsule (10 mg total) by mouth daily.   metroNIDAZOLE 500 MG tablet Commonly known as: Flagyl Take 1 tablet (500 mg total) by mouth 2 (two) times daily.   PNV Prenatal Plus Multivit+DHA 27-1 & 312 MG Misc Take 1 tablet by mouth daily.         Katrinka Blazing, [place] 10/27/2023  7:27 PM  4

## 2023-10-27 NOTE — MAU Note (Signed)
.  Sherry Franklin is a 18 y.o. at [redacted]w[redacted]d here in MAU reporting: she recently found out she was preg, states she has been cramping for about a week, and noticed light spotting today when wiping.  Denies recent intercourse.  LMP: 1/8  Pain score: 5 Vitals:   10/27/23 1530 10/27/23 1531  BP:  137/66  Pulse:  64  Resp: 17   Temp: 98.1 F (36.7 C)       Lab orders placed from triage: ua

## 2023-10-28 LAB — GC/CHLAMYDIA PROBE AMP (~~LOC~~) NOT AT ARMC
Chlamydia: NEGATIVE
Comment: NEGATIVE
Comment: NORMAL
Neisseria Gonorrhea: NEGATIVE

## 2023-11-05 ENCOUNTER — Inpatient Hospital Stay (HOSPITAL_COMMUNITY)
Admission: AD | Admit: 2023-11-05 | Discharge: 2023-11-06 | Disposition: A | Discharge status: Home / Self Care | Payer: Medicaid Other | Attending: Obstetrics and Gynecology | Admitting: Obstetrics and Gynecology

## 2023-11-05 ENCOUNTER — Inpatient Hospital Stay (HOSPITAL_COMMUNITY): Payer: Medicaid Other

## 2023-11-05 ENCOUNTER — Encounter (HOSPITAL_COMMUNITY): Payer: Self-pay | Admitting: Obstetrics and Gynecology

## 2023-11-05 DIAGNOSIS — Z3A01 Less than 8 weeks gestation of pregnancy: Secondary | ICD-10-CM | POA: Diagnosis not present

## 2023-11-05 DIAGNOSIS — Z3491 Encounter for supervision of normal pregnancy, unspecified, first trimester: Secondary | ICD-10-CM

## 2023-11-05 DIAGNOSIS — O219 Vomiting of pregnancy, unspecified: Secondary | ICD-10-CM | POA: Insufficient documentation

## 2023-11-05 MED ORDER — ONDANSETRON 4 MG PO TBDP
8.0000 mg | ORAL_TABLET | Freq: Once | ORAL | Status: AC
  Administered 2023-11-05: 8 mg via ORAL
  Filled 2023-11-05: qty 2

## 2023-11-05 NOTE — MAU Provider Note (Incomplete)
 History     CSN: [id number]  Arrival date and time: 11/05/23 2300   Event Date/Time   First Provider Initiated Contact with Patient 11/05/23 2345      No chief complaint on file.  HPI  OB History     Gravida  1   Para      Term      Preterm      AB      Living         SAB      IAB      Ectopic      Multiple      Live Births              Past Medical History:  Diagnosis Date  . Hidradenitis suppurativa     Past Surgical History:  Procedure Laterality Date  . NO PAST SURGERIES      No family history on file.  Social History   Tobacco Use  . Smoking status: Never  . Smokeless tobacco: Never  Vaping Use  . Vaping status: Never Used  Substance Use Topics  . Alcohol use: Not Currently  . Drug use: Yes    Types: Marijuana    Allergies: No Known Allergies  Medications Prior to Admission  Medication Sig Dispense Refill Last Dose/Taking  . Prenatal Vit-Fe Fum-FA-Omega (PNV PRENATAL PLUS MULTIVIT+DHA) 27-1 & 312 MG MISC Take 1 tablet by mouth daily. 30 each 12 11/05/2023  . FLUoxetine (PROZAC) 10 MG capsule Take 1 capsule (10 mg total) by mouth daily. (Patient not taking: Reported on 11/05/2023) 30 capsule 0 Not Taking  . metroNIDAZOLE (FLAGYL) 500 MG tablet Take 1 tablet (500 mg total) by mouth 2 (two) times daily. 14 tablet 0     Review of Systems  Constitutional:  Negative for chills and fever.  HENT:  Negative for congestion and sore throat.   Eyes:  Negative for pain and visual disturbance.  Respiratory:  Negative for cough, chest tightness and shortness of breath.   Cardiovascular:  Negative for chest pain.  Gastrointestinal:  Positive for nausea and vomiting. Negative for abdominal pain and diarrhea.  Endocrine: Negative for cold intolerance and heat intolerance.  Genitourinary:  Negative for dysuria and flank pain.  Musculoskeletal:  Negative for back pain.  Skin:  Negative for rash.  Allergic/Immunologic: Negative for food  allergies.  Neurological:  Negative for dizziness and light-headedness.  Psychiatric/Behavioral:  Negative for agitation.    Physical Exam   Blood pressure 129/75, pulse 87, temperature 98.2 F (36.8 C), resp. rate 17, height 5\' 8"  (1.727 m), weight 86.6 kg, last menstrual period 09/18/2023, SpO2 100%.  Physical Exam Vitals and nursing note reviewed.  Constitutional:      General: She is not in acute distress.    Appearance: She is well-developed.  HENT:     Head: Normocephalic and atraumatic.     Mouth/Throat:     Mouth: Mucous membranes are dry.  Eyes:     General: No scleral icterus.    Conjunctiva/sclera: Conjunctivae normal.  Cardiovascular:     Rate and Rhythm: Normal rate.  Pulmonary:     Effort: Pulmonary effort is normal.  Chest:     Chest wall: No tenderness.  Abdominal:     Palpations: Abdomen is soft.     Tenderness: There is no abdominal tenderness. There is no guarding or rebound.  Genitourinary:    Vagina: Normal.  Musculoskeletal:        General: Normal range of motion.  Cervical back: Normal range of motion and neck supple.  Skin:    General: Skin is warm and dry.     Findings: No rash.  Neurological:     Mental Status: She is alert and oriented to person, place, and time.     MAU Course  Procedures  [id number] Elyzabeth Dedmon 2005/10/01  Patient informed that the ultrasound is considered a limited OB ultrasound and is not intended to be a complete ultrasound exam.  Patient also informed that the ultrasound is not being completed with the intent of assessing for fetal or placental anomalies or any pelvic abnormalities.  Explained that the purpose of today's ultrasound is to assess for  viability.  Patient acknowledges the purpose of the exam and the limitations of the study.  Two fluid sacs were seen on BSUS  formal US ordered Reviewed images from prior US on 2/16 which did NOT see two GS but saw GS with [facility]    Federico Flake,  MD 11/05/2023, 11:46 PM   MDM Zofran in triage  Assessment and Plan  ***  Federico Flake 11/05/2023, 11:45 PM

## 2023-11-05 NOTE — MAU Note (Signed)
 Dr Alvester Morin in Triage to see pt and do bedside u/s. Will do formal u/s and pt agrees with plan of care.

## 2023-11-05 NOTE — MAU Note (Signed)
.  Sherry Franklin is a 18 y.o. at [redacted]w[redacted]d here in MAU reporting not being able to eat for a week. Cannot even drink water. No meds for nausea. Occ abd cramping but no pain currently  LMP: n/a Onset of complaint: a week Pain score: 0 Vitals:   11/05/23 2311 11/05/23 2312  BP:  129/75  Pulse: 87   Resp: 17   Temp: 98.2 F (36.8 C)   SpO2: 100%      FHT: n/a  Lab orders placed from triage: u/a

## 2023-11-05 NOTE — MAU Provider Note (Signed)
 History    CSN: [id number] Arrival date and time: 11/05/23 2300   Event Date/Time   First Provider Initiated Contact with Patient 11/05/23 2345      Chief Complaint  Patient presents with   Emesis   HPI Sherry Franklin is a 18 y.o. G1P0 at [redacted]w[redacted]d presenting to the maternity assessment unit with 1 week of inability to tolerate oral fluids or food.  She reports she has not been able to keep anything down.  She has not tried any over-the-counter medications for nausea.  She was seen here in the MAU about 1 week ago and was not having nausea at that time.  She denies any bleeding or lower abdominal pain.  She has not yet established prenatal care.  She is wondering about the baby and wants to make sure that they are okay  OB History     Gravida  1   Para      Term      Preterm      AB      Living         SAB      IAB      Ectopic      Multiple      Live Births              Past Medical History:  Diagnosis Date   Hidradenitis suppurativa     Past Surgical History:  Procedure Laterality Date   NO PAST SURGERIES      No family history on file.  Social History   Tobacco Use   Smoking status: Never   Smokeless tobacco: Never  Vaping Use   Vaping status: Never Used  Substance Use Topics   Alcohol use: Not Currently   Drug use: Yes    Types: Marijuana    Allergies: No Known Allergies  No medications prior to admission.    Review of Systems  Constitutional:  Negative for chills and fever.  HENT:  Negative for congestion and sore throat.   Eyes:  Negative for pain and visual disturbance.  Respiratory:  Negative for cough, chest tightness and shortness of breath.   Cardiovascular:  Negative for chest pain.  Gastrointestinal:  Positive for nausea and vomiting. Negative for abdominal pain and diarrhea.  Endocrine: Negative for cold intolerance and heat intolerance.  Genitourinary:  Negative for dysuria and flank pain.  Musculoskeletal:  Negative for back  pain.  Skin:  Negative for rash.  Allergic/Immunologic: Negative for food allergies.  Neurological:  Negative for dizziness and light-headedness.  Psychiatric/Behavioral:  Negative for agitation.    Physical Exam   Blood pressure 129/75, pulse 87, temperature 98.2 F (36.8 C), resp. rate 17, height 5\' 8"  (1.727 m), weight 86.6 kg, last menstrual period 09/18/2023, SpO2 100%.  Physical Exam Vitals and nursing note reviewed.  Constitutional:      General: She is not in acute distress.    Appearance: She is well-developed.  HENT:     Head: Normocephalic and atraumatic.     Mouth/Throat:     Mouth: Mucous membranes are dry.  Eyes:     General: No scleral icterus.    Conjunctiva/sclera: Conjunctivae normal.  Cardiovascular:     Rate and Rhythm: Normal rate.  Pulmonary:     Effort: Pulmonary effort is normal.  Chest:     Chest wall: No tenderness.  Abdominal:     Palpations: Abdomen is soft.     Tenderness: There is no abdominal tenderness. There is no guarding  or rebound.  Genitourinary:    Vagina: Normal.  Musculoskeletal:        General: Normal range of motion.     Cervical back: Normal range of motion and neck supple.  Skin:    General: Skin is warm and dry.     Findings: No rash.  Neurological:     Mental Status: She is alert and oriented to person, place, and time.     MAU Course  Procedures  [id number] Sherry Franklin 2006/05/11  Patient informed that the ultrasound is considered a limited OB ultrasound and is not intended to be a complete ultrasound exam.  Patient also informed that the ultrasound is not being completed with the intent of assessing for fetal or placental anomalies or any pelvic abnormalities.  Explained that the purpose of today's ultrasound is to assess for  viability.  Patient acknowledges the purpose of the exam and the limitations of the study.  Two fluid sacs were seen on BSUS  formal US ordered-- my images might be due to the use of  transabdominal probe as well.   Reviewed images from prior US on 2/16 which did NOT see two GS but saw GS with [facility]    Sherry Flake, MD 11/06/2023, 3:25 AM   MDM Zofran in triage  Formal US images per reviewed, does not have twin pregnanc and transvaginal approached clearly demonstrated this  US OB Transvaginal Result Date: 11/05/2023 CLINICAL DATA:  Pregnancy with uncertain fetal viability. Previous intrauterine pregnancy was demonstrated. Estimated gestational age by LMP is 6 weeks 6 days. Quantitative beta HCG is not provided. EXAM: TRANSVAGINAL OB ULTRASOUND TECHNIQUE: Transvaginal ultrasound was performed for complete evaluation of the gestation as well as the maternal uterus, adnexal regions, and pelvic cul-de-sac. COMPARISON:  10/27/2023 FINDINGS: Intrauterine gestational sac: A single intrauterine gestational sac is identified. Yolk sac:  Yolk sac is present. Embryo:  Fetal pole is observed. Cardiac Activity: Fetal cardiac activity is visualized. Heart Rate: 140 bpm CRL:   10.1 mm   7 w 1 d                  [place] EDC: 06/22/2024 Subchorionic hemorrhage:  None visualized. Maternal uterus/adnexae: Uterus is anteverted. No myometrial mass lesions identified. Both ovaries are visualized and appear normal. No abnormal fluid collections. IMPRESSION: Single intrauterine pregnancy. Estimated gestational age by crown-rump length is 7 weeks 1 day. This represents appropriate interval growth since previous study. No acute complication is demonstrated sonographically. Electronically Signed   By: Burman Nieves M.D.   On: 11/05/2023 23:57    Assessment and Plan   1. Nausea and vomiting during pregnancy prior to [redacted] weeks gestation   2. Viable pregnancy in first trimester   3. [redacted] weeks gestation of pregnancy    - Confirmed with patient and partner this is NOT a twin gestation - Rx for zofran and phenergan sent to pharmacy - Reviewed reason to return to MAU - Given list of OB providers  previously  - OTC meds list given today  Allergies as of 11/06/2023   No Known Allergies      Medication List     TAKE these medications    FLUoxetine 10 MG capsule Commonly known as: PROZAC Take 1 capsule (10 mg total) by mouth daily.   metroNIDAZOLE 500 MG tablet Commonly known as: Flagyl Take 1 tablet (500 mg total) by mouth 2 (two) times daily.   ondansetron 4 MG disintegrating tablet Commonly known as: ZOFRAN-ODT Take 1 tablet (  4 mg total) by mouth every 6 (six) hours as needed for nausea.   PNV Prenatal Plus Multivit+DHA 27-1 & 312 MG Misc Take 1 tablet by mouth daily.   promethazine 12.5 MG tablet Commonly known as: PHENERGAN Take 1 tablet (12.5 mg total) by mouth every 6 (six) hours as needed for nausea or vomiting.         Isa Rankin [facility] 11/06/2023, 3:25 AM

## 2023-11-06 LAB — URINALYSIS, ROUTINE W REFLEX MICROSCOPIC
Bilirubin Urine: NEGATIVE
Glucose, UA: NEGATIVE mg/dL
Hgb urine dipstick: NEGATIVE
Ketones, ur: 20 mg/dL — AB
Leukocytes,Ua: NEGATIVE
Nitrite: NEGATIVE
Protein, ur: 30 mg/dL — AB
Specific Gravity, Urine: 1.027 (ref 1.005–1.030)
pH: 5 (ref 5.0–8.0)

## 2023-11-06 MED ORDER — ONDANSETRON 4 MG PO TBDP: 4.0000 mg | ORAL_TABLET | Freq: Four times a day (QID) | ORAL | 0 refills | Status: DC | PRN

## 2023-11-06 MED ORDER — PROMETHAZINE HCL 12.5 MG PO TABS
12.5000 mg | ORAL_TABLET | Freq: Four times a day (QID) | ORAL | 0 refills | Status: DC | PRN
Start: 2023-11-06 — End: 2024-05-11

## 2023-11-06 NOTE — Progress Notes (Signed)
 Dr Alvester Morin in earlier to discuss d/c plan. Written and verbal d/c instructions given and understanding voiced.

## 2023-11-06 NOTE — Discharge Instructions (Signed)

## 2023-11-21 ENCOUNTER — Ambulatory Visit: Payer: Self-pay

## 2023-11-21 DIAGNOSIS — Z3480 Encounter for supervision of other normal pregnancy, unspecified trimester: Secondary | ICD-10-CM | POA: Insufficient documentation

## 2023-11-21 DIAGNOSIS — Z3A09 9 weeks gestation of pregnancy: Secondary | ICD-10-CM | POA: Diagnosis not present

## 2023-11-21 DIAGNOSIS — Z3401 Encounter for supervision of normal first pregnancy, first trimester: Secondary | ICD-10-CM

## 2023-11-21 MED ORDER — BLOOD PRESSURE KIT DEVI: 1.0000 | 0 refills | Status: DC

## 2023-11-21 MED ORDER — PROMETHAZINE HCL 25 MG PO TABS: 25.0000 mg | ORAL_TABLET | Freq: Four times a day (QID) | ORAL | 1 refills | Status: DC | PRN

## 2023-11-21 NOTE — Patient Instructions (Signed)

## 2023-11-21 NOTE — Progress Notes (Signed)
 New OB Intake  I connected with Sherry Franklin  on 11/21/23 at 10:15 AM EDT by Telephone Visit and verified that I am speaking with the correct person using two identifiers. Nurse is located at [facility] and pt is located at home.  I discussed the limitations, risks, security and privacy concerns of performing an evaluation and management service by telephone and the availability of in person appointments. I also discussed with the patient that there may be a patient responsible charge related to this service. The patient expressed understanding and agreed to proceed.  I explained I am completing New OB Intake today. We discussed EDD of 06/24/2024, by Last Menstrual Period. Pt is G1P0000. I reviewed her allergies, medications and Medical/Surgical/OB history.    Patient Active Problem List   Diagnosis Date Noted   Supervision of other normal pregnancy, antepartum 11/21/2023   Cannabis use disorder, mild, abuse 07/28/2023   GAD (generalized anxiety disorder) 07/27/2023   Substance induced mood disorder (HCC) 07/26/2023    Concerns addressed today  Delivery Plans Plans to deliver at [facility]. Discussed the nature of our practice with multiple providers including residents and students. Due to the size of the practice, the delivering provider may not be the same as those providing prenatal care.   Patient is not interested in water birth. Offered upcoming OB visit with CNM to discuss further.  MyChart/Babyscripts MyChart access verified. I explained pt will have some visits in office and some virtually. Babyscripts instructions given and order placed. Patient verifies receipt of registration text/e-mail. Account successfully created and app downloaded. If patient is a candidate for Optimized scheduling, add to sticky note.   Blood Pressure Cuff/Weight Scale Blood pressure cuff ordered for patient to pick-up from [employer]. Explained after first prenatal appt pt will check weekly and  document in Babyscripts. Patient does not have weight scale; patient may purchase if they desire to track weight weekly in Babyscripts.  Anatomy US Explained first scheduled [place] will be around 19 weeks. Anatomy US scheduled for TBD at TBD.  Interested in [place]? If yes, send referral and doula dot phrase.   Is patient a candidate for Babyscripts Optimization? Yes, patient accepted    First visit review I reviewed new OB appt with patient. Explained pt will be seen by Dr. Marice Potter at first visit. Discussed Avelina Laine genetic screening with patient. Requests [facility]or[employer] and [facility].. Routine prenatal labs  not collected/ virtual visit.    Last Pap No results found for: "DIAGPAP"  Harrel Lemon, RN 11/21/2023  11:02 AM

## 2023-11-22 ENCOUNTER — Inpatient Hospital Stay (HOSPITAL_COMMUNITY)
Admission: AD | Admit: 2023-11-22 | Discharge: 2023-11-23 | Disposition: A | Discharge status: Home / Self Care | Attending: Obstetrics and Gynecology | Admitting: Obstetrics and Gynecology

## 2023-11-22 DIAGNOSIS — K5900 Constipation, unspecified: Secondary | ICD-10-CM | POA: Insufficient documentation

## 2023-11-22 DIAGNOSIS — Z3A09 9 weeks gestation of pregnancy: Secondary | ICD-10-CM | POA: Diagnosis not present

## 2023-11-22 DIAGNOSIS — O219 Vomiting of pregnancy, unspecified: Secondary | ICD-10-CM | POA: Insufficient documentation

## 2023-11-22 DIAGNOSIS — O99321 Drug use complicating pregnancy, first trimester: Secondary | ICD-10-CM | POA: Diagnosis not present

## 2023-11-22 DIAGNOSIS — O99611 Diseases of the digestive system complicating pregnancy, first trimester: Secondary | ICD-10-CM | POA: Insufficient documentation

## 2023-11-22 DIAGNOSIS — F1291 Cannabis use, unspecified, in remission: Secondary | ICD-10-CM | POA: Diagnosis not present

## 2023-11-22 DIAGNOSIS — Z3480 Encounter for supervision of other normal pregnancy, unspecified trimester: Secondary | ICD-10-CM

## 2023-11-22 LAB — URINALYSIS, ROUTINE W REFLEX MICROSCOPIC
Bacteria, UA: NONE SEEN
Bilirubin Urine: NEGATIVE
Glucose, UA: NEGATIVE mg/dL
Hgb urine dipstick: NEGATIVE
Ketones, ur: 20 mg/dL — AB
Leukocytes,Ua: NEGATIVE
Nitrite: NEGATIVE
Protein, ur: 30 mg/dL — AB
Specific Gravity, Urine: 1.029 (ref 1.005–1.030)
pH: 5 (ref 5.0–8.0)

## 2023-11-22 MED ORDER — METOCLOPRAMIDE HCL 10 MG PO TABS
10.0000 mg | ORAL_TABLET | Freq: Once | ORAL | Status: AC
  Administered 2023-11-22: 10 mg via ORAL
  Filled 2023-11-22: qty 1

## 2023-11-22 MED ORDER — LACTATED RINGERS IV BOLUS
1000.0000 mL | Freq: Once | INTRAVENOUS | Status: AC
  Administered 2023-11-23: 1000 mL via INTRAVENOUS

## 2023-11-22 NOTE — MAU Note (Signed)
 Pt says no BM in 2 weeks . Vomiting - a lot.  Was here - 2-25 Gave Zofran - last time taken- yesterday.- Phenergan- last night . [facility] Mild cramping 5/10

## 2023-11-22 NOTE — MAU Provider Note (Signed)
 History   CSN: [id number]  Arrival date and time: 11/22/23 1934   Event Date/Time   First Provider Initiated Contact with Patient 11/22/23 2028      Chief Complaint  Patient presents with   Emesis    Sherry Franklin  is a 18 y.o. G1P0 at [redacted]w[redacted]d who receives care at [facility].  She presents today for constipation and N/V.  She states she has not had a bowel movement in the past 2 weeks.  She states she has been having n/v since pregnancy discovery and was given medications. She reports she was given zofran and phenergan and last dose was yesterday morning (Zofran) and night (phenergan).  She states she last ate yogurt and oranges around 1400.   Patient endorses history of marijuana usage prior to pregnancy.   She reports some mild cramping that is manageable and rates it a 5-6/10 currently.    OB History     Gravida  1   Para  0   Term  0   Preterm  0   AB  0   Living  0      SAB  0   IAB  0   Ectopic  0   Multiple  0   Live Births  0           Past Medical History:  Diagnosis Date   Hidradenitis suppurativa     Past Surgical History:  Procedure Laterality Date   NO PAST SURGERIES      No family history on file.  Social History   Tobacco Use   Smoking status: Never   Smokeless tobacco: Never  Vaping Use   Vaping status: Never Used  Substance Use Topics   Alcohol use: Not Currently   Drug use: Not Currently    Types: Marijuana    Allergies: No Known Allergies  Medications Prior to Admission  Medication Sig Dispense Refill Last Dose/Taking   Blood Pressure Monitoring (BLOOD PRESSURE KIT) DEVI 1 Device by Does not apply route once a week. 1 each 0    FLUoxetine (PROZAC) 10 MG capsule Take 1 capsule (10 mg total) by mouth daily. (Patient not taking: Reported on 11/05/2023) 30 capsule 0    metroNIDAZOLE (FLAGYL) 500 MG tablet Take 1 tablet (500 mg total) by mouth 2 (two) times daily. 14 tablet 0    ondansetron (ZOFRAN-ODT) 4 MG disintegrating  tablet Take 1 tablet (4 mg total) by mouth every 6 (six) hours as needed for nausea. 20 tablet 0    Prenatal Vit-Fe Fum-FA-Omega (PNV PRENATAL PLUS MULTIVIT+DHA) 27-1 & 312 MG MISC Take 1 tablet by mouth daily. 30 each 12    promethazine (PHENERGAN) 12.5 MG tablet Take 1 tablet (12.5 mg total) by mouth every 6 (six) hours as needed for nausea or vomiting. 30 tablet 0    promethazine (PHENERGAN) 25 MG tablet Take 1 tablet (25 mg total) by mouth every 6 (six) hours as needed for nausea or vomiting. 30 tablet 1     Review of Systems  Gastrointestinal:  Positive for abdominal pain (Cramping), constipation, nausea and vomiting. Negative for diarrhea.  Genitourinary:  Negative for difficulty urinating, dysuria, vaginal bleeding and vaginal discharge.  Neurological:  Positive for dizziness. Negative for light-headedness and headaches.   Physical Exam   Blood pressure 126/66, pulse 97, temperature 98.4 F (36.9 C), temperature source Oral, resp. rate 14, height 5\' 8"  (1.727 m), weight 83.3 kg, last menstrual period 09/18/2023.  Physical Exam Vitals and nursing note reviewed.  Exam conducted with a chaperone present Stanton Kidney, RN).  Constitutional:      Appearance: Normal appearance.  HENT:     Head: Normocephalic and atraumatic.  Eyes:     Conjunctiva/sclera: Conjunctivae normal.  Cardiovascular:     Rate and Rhythm: Normal rate.  Pulmonary:     Effort: Pulmonary effort is normal. No respiratory distress.  Musculoskeletal:        General: Normal range of motion.     Cervical back: Normal range of motion.  Skin:    General: Skin is warm and dry.  Neurological:     Mental Status: She is alert and oriented to person, place, and time.  Psychiatric:        Mood and Affect: Mood normal.        Behavior: Behavior normal.     MAU Course  Procedures Results for orders placed or performed during the hospital encounter of 11/22/23 (from the past 24 hours)  Urinalysis, Routine w reflex  microscopic -Urine, Clean Catch     Status: Abnormal   Collection Time: 11/22/23  8:46 PM  Result Value Ref Range   Color, Urine AMBER (A) YELLOW   APPearance HAZY (A) CLEAR   Specific Gravity, Urine 1.029 1.005 - 1.030   pH 5.0 5.0 - 8.0   Glucose, UA NEGATIVE NEGATIVE mg/dL   Hgb urine dipstick NEGATIVE NEGATIVE   Bilirubin Urine NEGATIVE NEGATIVE   Ketones, ur 20 (A) NEGATIVE mg/dL   Protein, ur 30 (A) NEGATIVE mg/dL   Nitrite NEGATIVE NEGATIVE   Leukocytes,Ua NEGATIVE NEGATIVE   RBC / HPF 0-5 0 - 5 RBC/hpf   WBC, UA 0-5 0 - 5 WBC/hpf   Bacteria, UA NONE SEEN NONE SEEN   Squamous Epithelial / HPF 11-20 0 - 5 /HPF   Mucus PRESENT    Hyaline Casts, UA PRESENT     MDM Start IV LR Bolus Antiemetic Prescription Assessment and Plan  18 year old, G1P0  SIUP at 9.2 weeks Constipation N/V  -POC reviewed with patient while in triage. -Exam performed.  -Informed that she will be placed back in waiting room, but could have medications if desired.  -Patient agreeable. -Reglan ordered.  -UA ordered. -Plan to obtain labs and possible IV once UA results and room available.   Cherre Robins 11/22/2023, 8:28 PM   Reassessment (12:04 AM) -Patient reports she has had 3 incidents of vomiting since receiving Reglan. -Discussed receiving IV medications and patient agreeable. -Will start IV and give LR, Zofran, and haladol. -Monitor and reassess.   Reassessment (1:10 AM) -Nurse reports patient requesting discharge. -Order placed for bonjesta. -Nurse to give precautions. -Discharge to home in stable condition.   Cherre Robins MSN, CNM Advanced Practice Provider, [facility] [employer]

## 2023-11-23 MED ORDER — SODIUM CHLORIDE 0.9 % IV SOLN
8.0000 mg | Freq: Once | INTRAVENOUS | Status: AC
  Administered 2023-11-23: 8 mg via INTRAVENOUS
  Filled 2023-11-23: qty 4

## 2023-11-23 MED ORDER — BONJESTA 20-20 MG PO TBCR: 1.0000 | EXTENDED_RELEASE_TABLET | Freq: Every day | ORAL | 2 refills | Status: DC

## 2023-11-23 MED ORDER — HALOPERIDOL LACTATE 5 MG/ML IJ SOLN
2.5000 mg | Freq: Once | INTRAMUSCULAR | Status: AC
  Administered 2023-11-23: 2.5 mg via INTRAVENOUS
  Filled 2023-11-23: qty 1

## 2023-11-23 NOTE — Discharge Instructions (Signed)

## 2023-12-09 ENCOUNTER — Encounter: Payer: Self-pay | Admitting: Physician Assistant

## 2023-12-09 ENCOUNTER — Ambulatory Visit: Payer: Self-pay | Admitting: Obstetrics & Gynecology

## 2023-12-09 ENCOUNTER — Other Ambulatory Visit (HOSPITAL_COMMUNITY)
Admission: RE | Admit: 2023-12-09 | Discharge: 2023-12-09 | Disposition: A | Discharge status: Home / Self Care | Source: Ambulatory Visit | Attending: Obstetrics & Gynecology | Admitting: Obstetrics & Gynecology

## 2023-12-09 ENCOUNTER — Encounter: Payer: Self-pay | Admitting: Obstetrics & Gynecology

## 2023-12-09 VITALS — BP 128/68 | HR 78 | Wt 179.0 lb

## 2023-12-09 DIAGNOSIS — Z3400 Encounter for supervision of normal first pregnancy, unspecified trimester: Secondary | ICD-10-CM | POA: Diagnosis present

## 2023-12-09 DIAGNOSIS — Z3401 Encounter for supervision of normal first pregnancy, first trimester: Secondary | ICD-10-CM

## 2023-12-09 DIAGNOSIS — O99341 Other mental disorders complicating pregnancy, first trimester: Secondary | ICD-10-CM | POA: Diagnosis not present

## 2023-12-09 DIAGNOSIS — Z31430 Encounter of female for testing for genetic disease carrier status for procreative management: Secondary | ICD-10-CM | POA: Diagnosis not present

## 2023-12-09 DIAGNOSIS — Z3480 Encounter for supervision of other normal pregnancy, unspecified trimester: Secondary | ICD-10-CM

## 2023-12-09 DIAGNOSIS — Z3A11 11 weeks gestation of pregnancy: Secondary | ICD-10-CM

## 2023-12-09 DIAGNOSIS — F411 Generalized anxiety disorder: Secondary | ICD-10-CM

## 2023-12-09 NOTE — Progress Notes (Signed)
 Pt presents for NOB visit. Requesting BH services.

## 2023-12-09 NOTE — Progress Notes (Signed)
  Subjective:    Sherry Franklin is a single 18 yo G1P0000 at [redacted]w[redacted]d being seen today for her first obstetrical visit.  Her obstetrical history is significant for  GAD. She denies SI but does request a consult for talk therapy. She tried prozac in the past, but didn't like the way she felt while taking it . She is thinking about pumping postpartum. Pregnancy history fully reviewed.  Patient reports  nausea that is slowly improving .  Vitals:   12/09/23 1029  BP: 128/68  Pulse: 78  Weight: 179 lb (81.2 kg)    HISTORY: OB History  Gravida Para Term Preterm AB Living  1 0 0 0 0 0  SAB IAB Ectopic Multiple Live Births  0 0 0 0 0    # Outcome Date GA Lbr Len/2nd Weight Sex Type Anes PTL Lv  1 Current            Past Medical History:  Diagnosis Date   Hidradenitis suppurativa    Past Surgical History:  Procedure Laterality Date   NO PAST SURGERIES     History reviewed. No pertinent family history.   Exam    Uterus:     Pelvic Exam:    Exam declined She preferred to do a self swab.                           System: Breast:     Skin: normal coloration and turgor, no rashes    Neurologic: oriented   Extremities: normal strength, tone, and muscle mass   HEENT PERRLA   Mouth/Teeth mucous membranes moist, pharynx normal without lesions   Neck supple   Cardiovascular: regular rate and rhythm   Respiratory:  appears well, vitals normal, no respiratory distress, acyanotic, normal RR, ear and throat exam is normal, neck free of mass or lymphadenopathy, chest clear, no wheezing, crepitations, rhonchi, normal symmetric air entry   Abdomen: soft, non-tender; bowel sounds normal; no masses,  no organomegaly          Assessment:    Pregnancy: G1P0000 Patient Active Problem List   Diagnosis Date Noted   Supervision of other normal pregnancy, antepartum 11/21/2023   Cannabis use disorder, mild, abuse 07/28/2023   GAD (generalized anxiety disorder) 07/27/2023   Substance  induced mood disorder (HCC) 07/26/2023        Plan:     Initial labs drawn. Prenatal vitamins. Problem list reviewed and updated. Genetic Screening discussed, Horizon and Panorama ordered. She does not want to know the gender until the ultrasound.  Ultrasound discussed; fetal survey: ordered. Amb ref psych ordered, She declines meds at this time. She reports that she has not smoked marijuana since finding out that she is pregnant.  Follow up in 4 weeks.   Sherry Franklin 12/09/2023

## 2023-12-10 LAB — CBC/D/PLT+RPR+RH+ABO+RUBIGG...
Antibody Screen: NEGATIVE
Basophils Absolute: 0 10*3/uL (ref 0.0–0.2)
Basos: 1 %
EOS (ABSOLUTE): 0 10*3/uL (ref 0.0–0.4)
Eos: 0 %
HCV Ab: NONREACTIVE
HIV Screen 4th Generation wRfx: NONREACTIVE
Hematocrit: 38.8 % (ref 34.0–46.6)
Hemoglobin: 12.9 g/dL (ref 11.1–15.9)
Hepatitis B Surface Ag: NEGATIVE
Immature Grans (Abs): 0 10*3/uL (ref 0.0–0.1)
Immature Granulocytes: 0 %
Lymphocytes Absolute: 1.2 10*3/uL (ref 0.7–3.1)
Lymphs: 24 %
MCH: 28.8 pg (ref 26.6–33.0)
MCHC: 33.2 g/dL (ref 31.5–35.7)
MCV: 87 fL (ref 79–97)
Monocytes Absolute: 0.2 10*3/uL (ref 0.1–0.9)
Monocytes: 5 %
Neutrophils Absolute: 3.7 10*3/uL (ref 1.4–7.0)
Neutrophils: 70 %
Platelets: 219 10*3/uL (ref 150–450)
RBC: 4.48 x10E6/uL (ref 3.77–5.28)
RDW: 13 % (ref 11.7–15.4)
RPR Ser Ql: NONREACTIVE
Rh Factor: POSITIVE
Rubella Antibodies, IGG: 6 {index} (ref >0.99)
WBC: 5.2 10*3/uL (ref 3.4–10.8)

## 2023-12-10 LAB — CERVICOVAGINAL ANCILLARY ONLY
Bacterial Vaginitis (gardnerella): POSITIVE — AB
Candida Glabrata: NEGATIVE
Candida Vaginitis: NEGATIVE
Comment: NEGATIVE
Comment: NEGATIVE
Comment: NEGATIVE
Comment: NEGATIVE
Trichomonas: NEGATIVE

## 2023-12-10 LAB — HCV INTERPRETATION

## 2023-12-11 LAB — URINE CULTURE, OB REFLEX

## 2023-12-11 LAB — CULTURE, OB URINE

## 2023-12-14 LAB — PANORAMA PRENATAL TEST FULL PANEL:PANORAMA TEST PLUS 5 ADDITIONAL MICRODELETIONS: FETAL FRACTION: 5.9

## 2023-12-17 LAB — HORIZON CUSTOM: REPORT SUMMARY: NEGATIVE

## 2023-12-25 ENCOUNTER — Encounter: Payer: Self-pay | Admitting: Obstetrics & Gynecology

## 2023-12-25 ENCOUNTER — Other Ambulatory Visit: Payer: Self-pay | Admitting: Obstetrics & Gynecology

## 2023-12-25 DIAGNOSIS — B9689 Other specified bacterial agents as the cause of diseases classified elsewhere: Secondary | ICD-10-CM

## 2023-12-25 MED ORDER — METRONIDAZOLE 500 MG PO TABS: 500.0000 mg | ORAL_TABLET | Freq: Two times a day (BID) | ORAL | 0 refills | Status: DC

## 2023-12-25 NOTE — Progress Notes (Signed)
 Flagyl prescribed to treat bv.  Mychart message sent.

## 2023-12-30 ENCOUNTER — Encounter (HOSPITAL_COMMUNITY): Payer: Self-pay | Admitting: Obstetrics and Gynecology

## 2023-12-30 ENCOUNTER — Inpatient Hospital Stay (HOSPITAL_COMMUNITY)

## 2023-12-30 ENCOUNTER — Inpatient Hospital Stay (HOSPITAL_COMMUNITY)
Admission: AD | Admit: 2023-12-30 | Discharge: 2023-12-31 | Disposition: A | Discharge status: Home / Self Care | Attending: Obstetrics and Gynecology | Admitting: Obstetrics and Gynecology

## 2023-12-30 DIAGNOSIS — O209 Hemorrhage in early pregnancy, unspecified: Secondary | ICD-10-CM

## 2023-12-30 DIAGNOSIS — Z3A14 14 weeks gestation of pregnancy: Secondary | ICD-10-CM | POA: Diagnosis not present

## 2023-12-30 DIAGNOSIS — O4692 Antepartum hemorrhage, unspecified, second trimester: Secondary | ICD-10-CM | POA: Diagnosis not present

## 2023-12-30 LAB — WET PREP, GENITAL
Sperm: NONE SEEN
Trich, Wet Prep: NONE SEEN
WBC, Wet Prep HPF POC: <10 (ref <10)
Yeast Wet Prep HPF POC: NONE SEEN

## 2023-12-30 NOTE — MAU Note (Addendum)
.  Sherry Franklin is a 18 y.o. at [redacted]w[redacted]d here in MAU reporting: vaginal bleeding that began at 2145-states dark red bleeding with small pea sized clots Last intercourse yesterday  Onset of complaint: 2145 Pain score: 4 abdomen Vitals:   12/30/23 2233  BP: 138/62  Pulse: 84  Resp: 16  Temp: 98.2 F (36.8 C)  SpO2: 100%     FHT: 144bpm  Lab orders placed from triage:

## 2023-12-30 NOTE — MAU Provider Note (Signed)
 History     CSN: [id number]  Arrival date and time: 12/30/23 2158   None     Chief Complaint  Patient presents with   Vaginal Bleeding   HPI Patient presenting today for vaginal bleeding affecting pregnancy.  Patient reports that she was at work today and noticed her panties were wet.  She thought she had urinated on herself and went to the bathroom and it was blood.  Reports that she also noticed cramping which started this morning.  Patient acknowledges intercourse which happened yesterday.  Has had a recent episode of bacterial vaginosis which was treated.  Denies any other symptoms at this time.  OB History     Gravida  1   Para  0   Term  0   Preterm  0   AB  0   Living  0      SAB  0   IAB  0   Ectopic  0   Multiple  0   Live Births  0           Past Medical History:  Diagnosis Date   Hidradenitis suppurativa     Past Surgical History:  Procedure Laterality Date   NO PAST SURGERIES      No family history on file.  Social History   Tobacco Use   Smoking status: Never   Smokeless tobacco: Never  Vaping Use   Vaping status: Never Used  Substance Use Topics   Alcohol use: Not Currently   Drug use: Not Currently    Types: Marijuana    Allergies: No Known Allergies  Medications Prior to Admission  Medication Sig Dispense Refill Last Dose/Taking   Blood Pressure Monitoring (BLOOD PRESSURE KIT) DEVI 1 Device by Does not apply route once a week. 1 each 0    Doxylamine-Pyridoxine ER (BONJESTA ) 20-20 MG TBCR Take 1 tablet by mouth at bedtime. (Patient not taking: Reported on 12/09/2023) 60 tablet 2    metroNIDAZOLE  (FLAGYL ) 500 MG tablet Take 1 tablet (500 mg total) by mouth 2 (two) times daily. 14 tablet 0    ondansetron  (ZOFRAN -ODT) 4 MG disintegrating tablet Take 1 tablet (4 mg total) by mouth every 6 (six) hours as needed for nausea. (Patient not taking: Reported on 12/09/2023) 20 tablet 0    Prenatal Vit-Fe Fum-FA-Omega (PNV PRENATAL PLUS  MULTIVIT+DHA) 27-1 & 312 MG MISC Take 1 tablet by mouth daily. (Patient not taking: Reported on 12/09/2023) 30 each 12    promethazine  (PHENERGAN ) 12.5 MG tablet Take 1 tablet (12.5 mg total) by mouth every 6 (six) hours as needed for nausea or vomiting. (Patient not taking: Reported on 12/09/2023) 30 tablet 0    promethazine  (PHENERGAN ) 25 MG tablet Take 1 tablet (25 mg total) by mouth every 6 (six) hours as needed for nausea or vomiting. (Patient not taking: Reported on 12/09/2023) 30 tablet 1     Review of Systems  Gastrointestinal:  Positive for abdominal pain.  Genitourinary:  Positive for vaginal bleeding, vaginal discharge and vaginal pain.   Physical Exam   Blood pressure 138/62, pulse 80, temperature 98.2 F (36.8 C), temperature source Oral, resp. rate 16, height 5\' 8"  (1.727 m), last menstrual period 09/18/2023, SpO2 100%.  Physical Exam Vitals reviewed.  Constitutional:      Appearance: Normal appearance.  HENT:     Head: Normocephalic and atraumatic.     Nose: No congestion.  Eyes:     Extraocular Movements: Extraocular movements intact.  Cardiovascular:  Rate and Rhythm: Normal rate.  Pulmonary:     Effort: Pulmonary effort is normal.  Abdominal:     General: Abdomen is flat.     Palpations: Abdomen is soft.     Tenderness: There is no abdominal tenderness.  Musculoskeletal:        General: Normal range of motion.     Cervical back: Normal range of motion.  Skin:    General: Skin is warm.     Capillary Refill: Capillary refill takes less than 2 seconds.  Neurological:     General: No focal deficit present.     Mental Status: She is alert.  Psychiatric:        Mood and Affect: Mood normal.     MAU Course  Procedures  MDM Wet prep Ultrasound   Assessment and Plan  Sherry Franklin is a 18 year old G1 at 14 weeks 5 days presenting for vaginal bleeding in pregnancy.  Vaginal bleeding Patient with vaginal bleeding which started today.  Intercourse  yesterday.  Wet prep showing persistent BV.  When discussing treatment with the patient she reports that she just picked the treatment that she was previously prescribed up in has not been taking the medication yet. Ultrasound showing no abnormalities with close cervix, no signs of abruption.  Bleeding likely from intercourse.  Stressed the importance of treating the BV and pelvic rest.  Discussed strict return precautions.  Discussed that although we see no findings consistent with miscarriage on ultrasound this could be early signs of miscarriage.  Patient understanding.  Patient discharged home.  Atsushi Yom V Kiante Petrovich 12/30/2023, 10:56 PM

## 2023-12-31 DIAGNOSIS — O209 Hemorrhage in early pregnancy, unspecified: Secondary | ICD-10-CM

## 2023-12-31 DIAGNOSIS — Z3A14 14 weeks gestation of pregnancy: Secondary | ICD-10-CM

## 2023-12-31 NOTE — Discharge Instructions (Signed)
 It was a pleasure taking care of you.  Your bleeding is likely related to your bacterial vaginosis and recent intercourse.  Please be sure to take your medication to treat the bacterial vaginosis.  If your symptoms worsen or you have any other concerns please return for further evaluation.  I hope you have a wonderful night.

## 2024-01-06 ENCOUNTER — Ambulatory Visit: Admitting: Obstetrics & Gynecology

## 2024-01-06 VITALS — BP 120/76 | HR 83 | Wt 175.0 lb

## 2024-01-06 DIAGNOSIS — Z3A15 15 weeks gestation of pregnancy: Secondary | ICD-10-CM

## 2024-01-06 DIAGNOSIS — Z3401 Encounter for supervision of normal first pregnancy, first trimester: Secondary | ICD-10-CM | POA: Diagnosis not present

## 2024-01-06 NOTE — Progress Notes (Unsigned)
   PRENATAL VISIT NOTE  Subjective:  Sherry Franklin is a 18 y.o. G1P0000 at [redacted]w[redacted]d being seen today for ongoing prenatal care.  She is currently monitored for the following issues for this low-risk pregnancy and has Substance induced mood disorder (HCC); GAD (generalized anxiety disorder); Cannabis use disorder, mild, abuse; and Supervision of other normal pregnancy, antepartum on their problem list.  Patient reports no complaints.  Contractions: Not present. Vag. Bleeding: None.   . Denies leaking of fluid.   The following portions of the patient's history were reviewed and updated as appropriate: allergies, current medications, past family history, past medical history, past social history, past surgical history and problem list.   Objective:   Vitals:   01/06/24 1046  BP: 120/76  Pulse: 83  Weight: 175 lb (79.4 kg)    Fetal Status: Fetal Heart Rate (bpm): 145         General:  Alert, oriented and cooperative. Patient is in no acute distress.  Skin: Skin is warm and dry. No rash noted.   Cardiovascular: Normal heart rate noted  Respiratory: Normal respiratory effort, no problems with respiration noted  Abdomen: Soft, gravid, appropriate for gestational age.  Pain/Pressure: Absent     Pelvic: Cervical exam deferred        Extremities: Normal range of motion.     Mental Status: Normal mood and affect. Normal behavior. Normal judgment and thought content.   Assessment and Plan:  Pregnancy: G1P0000 at [redacted]w[redacted]d 1. Encounter for supervision of normal first pregnancy in first trimester (Primary) screening - AFP, Serum, Open Spina Bifida  2. [redacted] weeks gestation of pregnancy  - AFP, Serum, Open Spina Bifida  Preterm labor symptoms and general obstetric precautions including but not limited to vaginal bleeding, contractions, leaking of fluid and fetal movement were reviewed in detail with the patient. Please refer to After Visit Summary for other counseling recommendations.   Return in  about 4 weeks (around 02/03/2024).  Future Appointments  Date Time Provider Department Center  01/29/2024  8:00 AM [facility] PROVIDER 1 [facility]  01/29/2024  8:30 AM [facility] US3 [facility]    Onnie Bilis, MD

## 2024-01-08 LAB — AFP, SERUM, OPEN SPINA BIFIDA
AFP MoM: 1.87
AFP Value: 59.3 ng/mL
Gest. Age on Collection Date: 15.7 wk
Maternal Age At EDD: 20.7 a
OSBR Risk 1 IN: 2166
Test Results:: NEGATIVE
Weight: 175 [lb_av]

## 2024-01-29 ENCOUNTER — Ambulatory Visit: Attending: Obstetrics & Gynecology

## 2024-01-29 ENCOUNTER — Other Ambulatory Visit: Payer: Self-pay | Admitting: Obstetrics & Gynecology

## 2024-01-29 ENCOUNTER — Other Ambulatory Visit: Payer: Self-pay | Admitting: *Deleted

## 2024-01-29 ENCOUNTER — Ambulatory Visit (HOSPITAL_BASED_OUTPATIENT_CLINIC_OR_DEPARTMENT_OTHER): Admitting: Obstetrics and Gynecology

## 2024-01-29 VITALS — BP 125/56

## 2024-01-29 DIAGNOSIS — O99322 Drug use complicating pregnancy, second trimester: Secondary | ICD-10-CM

## 2024-01-29 DIAGNOSIS — F1290 Cannabis use, unspecified, uncomplicated: Secondary | ICD-10-CM

## 2024-01-29 DIAGNOSIS — F1910 Other psychoactive substance abuse, uncomplicated: Secondary | ICD-10-CM | POA: Insufficient documentation

## 2024-01-29 DIAGNOSIS — O99320 Drug use complicating pregnancy, unspecified trimester: Secondary | ICD-10-CM | POA: Insufficient documentation

## 2024-01-29 DIAGNOSIS — Z36 Encounter for antenatal screening for chromosomal anomalies: Secondary | ICD-10-CM | POA: Insufficient documentation

## 2024-01-29 DIAGNOSIS — Z3480 Encounter for supervision of other normal pregnancy, unspecified trimester: Secondary | ICD-10-CM

## 2024-01-29 DIAGNOSIS — F32A Depression, unspecified: Secondary | ICD-10-CM | POA: Diagnosis not present

## 2024-01-29 DIAGNOSIS — O99342 Other mental disorders complicating pregnancy, second trimester: Secondary | ICD-10-CM | POA: Insufficient documentation

## 2024-01-29 DIAGNOSIS — F419 Anxiety disorder, unspecified: Secondary | ICD-10-CM | POA: Insufficient documentation

## 2024-01-29 DIAGNOSIS — Z3A19 19 weeks gestation of pregnancy: Secondary | ICD-10-CM

## 2024-01-29 DIAGNOSIS — O358XX0 Maternal care for other (suspected) fetal abnormality and damage, not applicable or unspecified: Secondary | ICD-10-CM

## 2024-01-29 NOTE — Progress Notes (Signed)
  Maternal-Fetal Medicine Consultation Name: Sherry Franklin MRN: [id number]  G1 P0 at 19w gestation.  Patient is here for fetal anatomy scan. On cell-free fetal DNA screening, the risks of aneuploidies are not increased. MSAFP screening showed low risk for open-neural tube defects. Patient gives history of vaginal bleeding early in pregnancy that had resolved.  Ultrasound We performed fetal anatomy scan. No makers of aneuploidies or fetal structural defects are seen. Fetal biometry is consistent with her previously-established dates. Amniotic fluid is normal and good fetal activity is seen.  Placenta appears normal with no evidence of retroplacental hemorrhage. Patient understands the limitations of ultrasound in detecting fetal anomalies.  Patient gives a history of marijuana use early in pregnancy.  She had since discontinued.  Cannabis Marijuana is the most-commonly used recreational drug in pregnancy. About 7% to 10% of pregnant women take cannabis. It has been used by some to treat anxiety, depression, nausea and vomiting. A small increase in preterm delivery and neonatal intensive care admissions. No increase in fetal congenital malformations were reported with cannabis. Fetal brain has THC receptor, and long-term effects are not known.   Concomitant use of tobacco or other drugs increase the risk of pregnancy adverse outcomes. If cannabis is used frequently, it may lead to persistent vomiting. Antiemetics with proven safety are better to take in pregnancy than cannabis.  Some studies reported increase in stillbirth rates (absolute risk is very small) but that could be because of concomitant use of other recreational drugs or tobacco use. In consistent with ACOG recommendations, cannabis should be avoided in pregnancy. Recommendations -An appointment was made for her to return in 5 weeks for completion of fetal anatomy.   Consultation including face-to-face (more than 50%) counseling 20  minutes.

## 2024-02-04 ENCOUNTER — Encounter: Payer: Self-pay | Admitting: Obstetrics & Gynecology

## 2024-02-04 ENCOUNTER — Ambulatory Visit (INDEPENDENT_AMBULATORY_CARE_PROVIDER_SITE_OTHER): Admitting: Obstetrics & Gynecology

## 2024-02-04 VITALS — BP 111/68 | HR 81 | Wt 181.4 lb

## 2024-02-04 DIAGNOSIS — F1210 Cannabis abuse, uncomplicated: Secondary | ICD-10-CM | POA: Diagnosis not present

## 2024-02-04 DIAGNOSIS — Z3480 Encounter for supervision of other normal pregnancy, unspecified trimester: Secondary | ICD-10-CM

## 2024-02-04 NOTE — Progress Notes (Signed)
No concerns at this time. `

## 2024-02-04 NOTE — Progress Notes (Signed)
   PRENATAL VISIT NOTE  Subjective:  Sherry Franklin is a 17 y.o. G1P0000 at [redacted]w[redacted]d being seen today for ongoing prenatal care.  She is currently monitored for the following issues for this low-risk pregnancy and has Substance induced mood disorder (HCC); GAD (generalized anxiety disorder); Cannabis use disorder, mild, abuse; and Supervision of other normal pregnancy, antepartum on their problem list.  Patient reports no complaints.  Contractions: Not present. Vag. Bleeding: None.  Movement: Present. Denies leaking of fluid.   The following portions of the patient's history were reviewed and updated as appropriate: allergies, current medications, past family history, past medical history, past social history, past surgical history and problem list.   Objective:    Vitals:   02/04/24 0904  BP: 111/68  Pulse: 81  Weight: 181 lb 6.4 oz (82.3 kg)    Fetal Status:  Fetal Heart Rate (bpm): 141   Movement: Present    General: Alert, oriented and cooperative. Patient is in no acute distress.  Skin: Skin is warm and dry. No rash noted.   Cardiovascular: Normal heart rate noted  Respiratory: Normal respiratory effort, no problems with respiration noted  Abdomen: Soft, gravid, appropriate for gestational age.  Pain/Pressure: Absent     Pelvic: Cervical exam deferred        Extremities: Normal range of motion.  Edema: None  Mental Status: Normal mood and affect. Normal behavior. Normal judgment and thought content.   Assessment and Plan:  Pregnancy: G1P0000 at [redacted]w[redacted]d 1. Supervision of other normal pregnancy, antepartum (Primary) F/u US  to complete anatomy  2. Cannabis use disorder, mild, abuse Patient was counseled at MFM appt  Preterm labor symptoms and general obstetric precautions including but not limited to vaginal bleeding, contractions, leaking of fluid and fetal movement were reviewed in detail with the patient. Please refer to After Visit Summary for other counseling recommendations.    Return in about 4 weeks (around 03/03/2024).  Future Appointments  Date Time Provider Department Center  03/03/2024 10:55 AM [facility], Darren Em, CNM [facility] None  03/04/2024  9:00 AM [facility] PROVIDER 1 [facility]  03/04/2024  9:30 AM [facility] US1 [facility]    Onnie Bilis, MD

## 2024-02-12 ENCOUNTER — Telehealth: Payer: Self-pay

## 2024-02-12 DIAGNOSIS — Z3480 Encounter for supervision of other normal pregnancy, unspecified trimester: Secondary | ICD-10-CM

## 2024-02-12 MED ORDER — BLOOD PRESSURE KIT DEVI: 1.0000 | 0 refills | Status: AC

## 2024-02-12 NOTE — Telephone Encounter (Signed)
 Returned call and pt stated that she has been having headaches with dizziness and sensitivity to light. Pt states that she previously took Tylenol  and the HA was not relieved. Pt denies any pain or swelling today. Advised pt to come in for visit check BP. Pt states that she will pick up BP cuff and notify us  of the reading. Advised pt that if symptoms occur be evaluated at the [facility], pt agreed.

## 2024-03-03 ENCOUNTER — Encounter: Admitting: Advanced Practice Midwife

## 2024-03-04 ENCOUNTER — Ambulatory Visit: Attending: Obstetrics and Gynecology | Admitting: Maternal & Fetal Medicine

## 2024-03-04 ENCOUNTER — Ambulatory Visit

## 2024-03-04 DIAGNOSIS — Z3480 Encounter for supervision of other normal pregnancy, unspecified trimester: Secondary | ICD-10-CM

## 2024-03-04 DIAGNOSIS — F1910 Other psychoactive substance abuse, uncomplicated: Secondary | ICD-10-CM

## 2024-03-04 DIAGNOSIS — O99322 Drug use complicating pregnancy, second trimester: Secondary | ICD-10-CM

## 2024-03-04 DIAGNOSIS — O358XX0 Maternal care for other (suspected) fetal abnormality and damage, not applicable or unspecified: Secondary | ICD-10-CM | POA: Diagnosis not present

## 2024-03-04 DIAGNOSIS — Z3A24 24 weeks gestation of pregnancy: Secondary | ICD-10-CM | POA: Insufficient documentation

## 2024-03-04 DIAGNOSIS — Z3689 Encounter for other specified antenatal screening: Secondary | ICD-10-CM | POA: Diagnosis not present

## 2024-03-04 DIAGNOSIS — F1210 Cannabis abuse, uncomplicated: Secondary | ICD-10-CM

## 2024-03-04 NOTE — Progress Notes (Signed)
   Patient information  Patient Name: Sherry Franklin  Patient MRN:   [id number]  Referring practice: MFM Referring Provider: [facility]  Problem List   Patient Active Problem List   Diagnosis Date Noted   Supervision of other normal pregnancy, antepartum 11/21/2023   Cannabis use disorder, mild, abuse 07/28/2023   GAD (generalized anxiety disorder) 07/27/2023   Substance induced mood disorder (HCC) 07/26/2023   Maternal Fetal medicine Consult  Sherry Franklin is a 18 y.o. G1P0000 at [redacted]w[redacted]d here for ultrasound and consultation. Briyonna Bettcher is doing well today with no acute concerns. Today we focused on the following:   THC use: The patient reports that she quit all THC use early in pregnancy.  I discussed this likely has little to no impact on her fetus and no future ultrasounds are required at this time unless other indications arise.  The patient had time to ask questions that were answered to her satisfaction.  She verbalized understanding and agrees to proceed with the plan below.  Sonographic findings Single intrauterine pregnancy at 24w 0d.  Fetal cardiac activity:  Observed and appears normal. Presentation: Cephalic. Interval fetal anatomy appears normal. Fetal biometry shows the estimated fetal weight at the 45 percentile. Amniotic fluid volume: Within normal limits. MVP: 6.58 cm. Placenta: Anterior.  There are limitations of prenatal ultrasound such as the inability to detect certain abnormalities due to poor visualization. Various factors such as fetal position, gestational age and maternal body habitus may increase the difficulty in visualizing the fetal anatomy.    Recommendations -No further ultrasounds are recommended at this time based on the current indications. If future indications arise (e.g. size/date discrepancy on fundal height, gestational diabetes or hypertension) and an ultrasound is to be desired at our MFM office, please send a referral.   Review  of Systems: A review of systems was performed and was negative except per HPI   Vitals and Physical Exam    03/04/2024    9:16 AM 02/04/2024    9:04 AM 01/29/2024    8:08 AM  Vitals with BMI  Weight  181 lbs 6 oz   Systolic 132 111 874  Diastolic 68 68 56  Pulse 80 81     Sitting comfortably on the sonogram table Nonlabored breathing Normal rate and rhythm Abdomen is nontender  Past pregnancies OB History  Gravida Para Term Preterm AB Living  1 0 0 0 0 0  SAB IAB Ectopic Multiple Live Births  0 0 0 0 0    # Outcome Date GA Lbr Len/2nd Weight Sex Type Anes PTL Lv  1 Current              I spent 20 minutes reviewing the patients chart, including labs and images as well as counseling the patient about her medical conditions. Greater than 50% of the time was spent in direct face-to-face patient counseling.  Delora Smaller  MFM, [facility]   03/04/2024  10:01 AM

## 2024-03-05 ENCOUNTER — Encounter: Admitting: Obstetrics and Gynecology

## 2024-03-10 ENCOUNTER — Encounter: Admitting: Obstetrics and Gynecology

## 2024-03-12 ENCOUNTER — Inpatient Hospital Stay (HOSPITAL_COMMUNITY)
Admission: AD | Admit: 2024-03-12 | Discharge: 2024-03-12 | Disposition: A | Discharge status: Home / Self Care | Attending: Obstetrics and Gynecology | Admitting: Obstetrics and Gynecology

## 2024-03-12 ENCOUNTER — Encounter (HOSPITAL_COMMUNITY): Payer: Self-pay | Admitting: Obstetrics and Gynecology

## 2024-03-12 DIAGNOSIS — Z3A25 25 weeks gestation of pregnancy: Secondary | ICD-10-CM | POA: Diagnosis not present

## 2024-03-12 DIAGNOSIS — R42 Dizziness and giddiness: Secondary | ICD-10-CM

## 2024-03-12 DIAGNOSIS — O219 Vomiting of pregnancy, unspecified: Secondary | ICD-10-CM | POA: Diagnosis not present

## 2024-03-12 DIAGNOSIS — O26892 Other specified pregnancy related conditions, second trimester: Secondary | ICD-10-CM

## 2024-03-12 HISTORY — DX: Anxiety disorder, unspecified: F41.9

## 2024-03-12 LAB — URINALYSIS, ROUTINE W REFLEX MICROSCOPIC
Bilirubin Urine: NEGATIVE
Glucose, UA: NEGATIVE mg/dL
Hgb urine dipstick: NEGATIVE
Ketones, ur: NEGATIVE mg/dL
Nitrite: NEGATIVE
Protein, ur: 30 mg/dL — AB
Specific Gravity, Urine: 1.025 (ref 1.005–1.030)
pH: 5 (ref 5.0–8.0)

## 2024-03-12 NOTE — MAU Note (Signed)
 Sherry Franklin is a 18 y.o. at [redacted]w[redacted]d here in MAU reporting: been feeling really weak.  Threw up her breakfast, not unusual for her. Around 3, got really dizzy and almost passed out. Vision got dark, now seeing spots.still feeling dizzy Denies pain, bleeding or LOF.  Onset of complaint: 1500 Pain score: denies Vitals:   03/12/24 1750  Pulse: 100  Resp: 16  Temp: 97.8 F (36.6 C)  SpO2: 100%     FHT:145 Lab orders placed from triage:  urine

## 2024-03-12 NOTE — MAU Provider Note (Signed)
 Event Date/Time   First Provider Initiated Contact with Patient 03/12/24 1847      S Ms. Sherry Franklin is a 18 y.o. G1P0000 pregnant female at [redacted]w[redacted]d who presents to MAU today with complaint of vomiting and lightheadedness.  Pt states threw up at breakfast and hadn't been feeling like herself.  Around 1500 got dizzy and almost passed out, vision dark and seeping spots.  Still feels dizzy.  Denies pain, VB, LOF.  Endorses +FM.   Receives care at [facility]. Prenatal records reviewed.  Pertinent items noted in HPI and remainder of comprehensive ROS otherwise negative.   O BP 113/65 (BP Location: Right Arm)   Pulse (!) 113   Temp 97.8 F (36.6 C) (Oral)   Resp 16   Ht 5' 8 (1.727 m)   Wt 84.1 kg   LMP 09/18/2023 (Exact Date)   SpO2 100%   BMI 28.19 kg/m  Physical Exam Vitals and nursing note reviewed.  Constitutional:      General: She is not in acute distress.    Appearance: Normal appearance. She is not ill-appearing.  HENT:     Head: Normocephalic and atraumatic.     Right Ear: External ear normal.     Left Ear: External ear normal.     Nose: Nose normal. No congestion.     Mouth/Throat:     Mouth: Mucous membranes are dry.     Pharynx: Oropharynx is clear.  Eyes:     Extraocular Movements: Extraocular movements intact.     Conjunctiva/sclera: Conjunctivae normal.  Cardiovascular:     Rate and Rhythm: Normal rate.     Pulses: Normal pulses.  Pulmonary:     Effort: Pulmonary effort is normal. No respiratory distress.  Abdominal:     General: There is no distension.     Palpations: Abdomen is soft.     Tenderness: There is no abdominal tenderness.     Comments: gravid  Musculoskeletal:        General: No swelling. Normal range of motion.     Cervical back: Normal range of motion.  Skin:    General: Skin is warm and dry.  Neurological:     Mental Status: She is alert and oriented to person, place, and time. Mental status is at baseline.     Motor: No weakness.      Gait: Gait normal.  Psychiatric:        Mood and Affect: Mood normal.        Behavior: Behavior normal.    NST:  140bpm, moderate variability, +accels, no decels, no ctx, appropriate for GA  MDM: MAU Course:  UA - trace LE and rare bacteria, sending for UCX  Orthostatics negative. And patient states no longer dizzy.  Tolerate PO.  Encourage continued proper oral hydration.  Patient understanding / agreeable.    AP #[redacted] weeks gestation #Dizziness, resolved: believed 2/2 to poor hydration given no other symptoms and concentrated urine.   - proper Oral hydration - f/u OB UCX to ensure no UTI  Discharge from MAU in stable condition with strict/usual precautions Follow up at [facility] as scheduled for ongoing prenatal care  Allergies as of 03/12/2024   No Known Allergies      Medication List     TAKE these medications    Blood Pressure Kit Devi 1 Device by Does not apply route once a week. Large cuff   Bonjesta  20-20 MG Tbcr Generic drug: Doxylamine-Pyridoxine ER Take 1 tablet by mouth at bedtime.  ondansetron  4 MG disintegrating tablet Commonly known as: ZOFRAN -ODT Take 1 tablet (4 mg total) by mouth every 6 (six) hours as needed for nausea.   PNV Prenatal Plus Multivit+DHA 27-1 & 312 MG Misc Take 1 tablet by mouth daily.   promethazine  12.5 MG tablet Commonly known as: PHENERGAN  Take 1 tablet (12.5 mg total) by mouth every 6 (six) hours as needed for nausea or vomiting.   promethazine  25 MG tablet Commonly known as: PHENERGAN  Take 1 tablet (25 mg total) by mouth every 6 (six) hours as needed for nausea or vomiting.        Jhonny Augustin BROCKS, MD 03/12/2024 7:42 PM

## 2024-03-14 LAB — CULTURE, OB URINE: Special Requests: NORMAL

## 2024-03-17 ENCOUNTER — Inpatient Hospital Stay (HOSPITAL_COMMUNITY)
Admission: AD | Admit: 2024-03-17 | Discharge: 2024-03-17 | Disposition: A | Discharge status: Home / Self Care | Payer: Self-pay | Attending: Obstetrics and Gynecology | Admitting: Obstetrics and Gynecology

## 2024-03-17 DIAGNOSIS — Z3A25 25 weeks gestation of pregnancy: Secondary | ICD-10-CM | POA: Diagnosis not present

## 2024-03-17 DIAGNOSIS — O26892 Other specified pregnancy related conditions, second trimester: Secondary | ICD-10-CM | POA: Diagnosis present

## 2024-03-17 DIAGNOSIS — R55 Syncope and collapse: Secondary | ICD-10-CM

## 2024-03-17 DIAGNOSIS — R079 Chest pain, unspecified: Secondary | ICD-10-CM | POA: Insufficient documentation

## 2024-03-17 DIAGNOSIS — R42 Dizziness and giddiness: Secondary | ICD-10-CM

## 2024-03-17 LAB — COMPREHENSIVE METABOLIC PANEL WITH GFR
ALT: 14 U/L (ref 0–44)
AST: 15 U/L (ref 15–41)
Albumin: 2.8 g/dL — ABNORMAL LOW (ref 3.5–5.0)
Alkaline Phosphatase: 66 U/L (ref 38–126)
Anion gap: 8 (ref 5–15)
BUN: 7 mg/dL (ref 6–20)
CO2: 23 mmol/L (ref 22–32)
Calcium: 8.9 mg/dL (ref 8.9–10.3)
Chloride: 102 mmol/L (ref 98–111)
Creatinine, Ser: 0.49 mg/dL (ref 0.44–1.00)
GFR, Estimated: >60 mL/min (ref >60)
Glucose, Bld: 71 mg/dL (ref 70–99)
Potassium: 3.8 mmol/L (ref 3.5–5.1)
Sodium: 133 mmol/L — ABNORMAL LOW (ref 135–145)
Total Bilirubin: 0.4 mg/dL (ref 0.0–1.2)
Total Protein: 7.2 g/dL (ref 6.5–8.1)

## 2024-03-17 LAB — CBC
HCT: 34.5 % — ABNORMAL LOW (ref 36.0–46.0)
Hemoglobin: 11.6 g/dL — ABNORMAL LOW (ref 12.0–15.0)
MCH: 29.7 pg (ref 26.0–34.0)
MCHC: 33.6 g/dL (ref 30.0–36.0)
MCV: 88.2 fL (ref 80.0–100.0)
Platelets: 202 K/uL (ref 150–400)
RBC: 3.91 MIL/uL (ref 3.87–5.11)
RDW: 12.7 % (ref 11.5–15.5)
WBC: 8.4 K/uL (ref 4.0–10.5)
nRBC: 0 % (ref 0.0–0.2)

## 2024-03-17 MED ORDER — FERROUS SULFATE 325 (65 FE) MG PO TABS: 325.0000 mg | ORAL_TABLET | ORAL | 1 refills | Status: DC

## 2024-03-17 MED ORDER — MECLIZINE HCL 12.5 MG PO TABS: 12.5000 mg | ORAL_TABLET | Freq: Three times a day (TID) | ORAL | 0 refills | Status: DC | PRN

## 2024-03-17 NOTE — Discharge Instructions (Signed)
 LOW HEMOGLOBIN: Low hemoglobin is very common during pregnancy and is usually because iron is low. I would recommend we try to improve you hemoglobin level. We know that improving your pre-delivery hemoglobin reduces your risk of a hemorrhage.   I would recommend you try a supplement called Blood Builder in addition to your prenatal vitamin. It is available via [employer] and also at stores like [employer]. It uses plant based iron and is less prone to causing stomach and bowel upset. Here is a screen shot of the supplement.   Alternatively, you can start a regular iron supplement in addition to your prenatal vitamin. Either ferrous sulfate , ferrous gluconate, or ferrous fumarate. The recommendation is 100 mg every other day. These can all be found over-the-counter.  For some people this can be constipating, so you can also add a daily stool softener (Miralax) if needed.  About 10% of people will have difficulty with iron and it will cause GI upset. To reduce the chance of GI upset, take the supplement with food. Taking it at night so you sleep through potential side effects can also be helpful. If that does not help, taking a liquid formulation with food can help cause less GI symptoms.

## 2024-03-17 NOTE — MAU Provider Note (Signed)
 Chief Complaint:  No chief complaint on file.   HPI   None     Sherry Franklin is a 18 y.o. G1P0000 at [redacted]w[redacted]d who presents to maternity admissions reporting presyncope, lightheadedness.  She reports intermittent episodes of lightheadedness which she describes as feeling like she is going to pass out.  During those episodes, her vision gets dark and she sees spots.  This does not occur when she is laying flat, but it has happened sitting up and while walking.  She states that she does not get hot or sweaty during these episodes.  Pregnancy Course: Receives care at [facility]. Prenatal records reviewed.   Past Medical History:  Diagnosis Date   Anxiety    Hidradenitis suppurativa    OB History  Gravida Para Term Preterm AB Living  1 0 0 0 0 0  SAB IAB Ectopic Multiple Live Births  0 0 0 0 0    # Outcome Date GA Lbr Len/2nd Weight Sex Type Anes PTL Lv  1 Current            Past Surgical History:  Procedure Laterality Date   NO PAST SURGERIES     Family History  Problem Relation Age of Onset   Cancer Maternal Grandmother    Cancer Other    Asthma Neg Hx    Diabetes Neg Hx    Heart disease Neg Hx    Hypertension Neg Hx    Social History   Tobacco Use   Smoking status: Never   Smokeless tobacco: Never  Vaping Use   Vaping status: Never Used  Substance Use Topics   Alcohol use: Not Currently   Drug use: Not Currently    Types: Marijuana    Comment: last  use feb 2025   No Known Allergies Medications Prior to Admission  Medication Sig Dispense Refill Last Dose/Taking   Prenatal Vit-Fe Fum-FA-Omega (PNV PRENATAL PLUS MULTIVIT+DHA) 27-1 & 312 MG MISC Take 1 tablet by mouth daily. 30 each 12 Past Week   Blood Pressure Monitoring (BLOOD PRESSURE KIT) DEVI 1 Device by Does not apply route once a week. Large cuff 1 each 0    Doxylamine-Pyridoxine ER (BONJESTA ) 20-20 MG TBCR Take 1 tablet by mouth at bedtime. (Patient not taking: Reported on 01/29/2024) 60 tablet 2     ondansetron  (ZOFRAN -ODT) 4 MG disintegrating tablet Take 1 tablet (4 mg total) by mouth every 6 (six) hours as needed for nausea. (Patient not taking: Reported on 01/29/2024) 20 tablet 0    promethazine  (PHENERGAN ) 12.5 MG tablet Take 1 tablet (12.5 mg total) by mouth every 6 (six) hours as needed for nausea or vomiting. (Patient not taking: Reported on 01/29/2024) 30 tablet 0    promethazine  (PHENERGAN ) 25 MG tablet Take 1 tablet (25 mg total) by mouth every 6 (six) hours as needed for nausea or vomiting. (Patient not taking: Reported on 01/29/2024) 30 tablet 1     I have reviewed patient's Past Medical Hx, Surgical Hx, Family Hx, Social Hx, medications and allergies.   ROS  Pertinent items noted in HPI and remainder of comprehensive ROS otherwise negative.   PHYSICAL EXAM  Patient Vitals for the past 24 hrs:  BP Temp Temp src Pulse Resp SpO2  03/17/24 1707 113/61 -- -- 92 -- --  03/17/24 1649 120/65 98.1 F (36.7 C) Oral 83 14 100 %    Constitutional: Well-developed, well-nourished female in no acute distress.  HEENT: atraumatic, normocephalic. Neck has normal ROM. EOM intact. Cardiovascular: warm  and well-perfused. Regular rate and rhythm. No murmurs, rubs, or gallops. Respiratory: normal effort, no problems with respiration noted. CTA bilaterally. GI: Abd soft, non-tender, non-distended MSK: Extremities nontender, no edema, normal ROM Skin: warm and dry. Acyanotic, no jaundice or pallor. Neurologic: Alert and oriented x 4. No abnormal coordination. Psychiatric: Normal mood. Speech not slurred, not rapid/pressured. Patient is cooperative. GU: no CVA tenderness  Fetal Tracing: Baseline FHR: 145 per minute Fetal heart variability: moderate Fetal Heart Rate accelerations: yes Fetal Heart Rate decelerations: none Fetal Non-stress Test: Category I (reactive) Toco: uterine irritability   Labs: Results for orders placed or performed during the hospital encounter of 03/17/24 (from the  past 24 hours)  Comprehensive metabolic panel     Status: Abnormal   Collection Time: 03/17/24  5:12 PM  Result Value Ref Range   Sodium 133 (L) 135 - 145 mmol/L   Potassium 3.8 3.5 - 5.1 mmol/L   Chloride 102 98 - 111 mmol/L   CO2 23 22 - 32 mmol/L   Glucose, Bld 71 70 - 99 mg/dL   BUN 7 6 - 20 mg/dL   Creatinine, Ser 9.50 0.44 - 1.00 mg/dL   Calcium 8.9 8.9 - 89.6 mg/dL   Total Protein 7.2 6.5 - 8.1 g/dL   Albumin 2.8 (L) 3.5 - 5.0 g/dL   AST 15 15 - 41 U/L   ALT 14 0 - 44 U/L   Alkaline Phosphatase 66 38 - 126 U/L   Total Bilirubin 0.4 0.0 - 1.2 mg/dL   GFR, Estimated >39 >39 mL/min   Anion gap 8 5 - 15  CBC     Status: Abnormal   Collection Time: 03/17/24  5:12 PM  Result Value Ref Range   WBC 8.4 4.0 - 10.5 K/uL   RBC 3.91 3.87 - 5.11 MIL/uL   Hemoglobin 11.6 (L) 12.0 - 15.0 g/dL   HCT 65.4 (L) 63.9 - 53.9 %   MCV 88.2 80.0 - 100.0 fL   MCH 29.7 26.0 - 34.0 pg   MCHC 33.6 30.0 - 36.0 g/dL   RDW 87.2 88.4 - 84.4 %   Platelets 202 150 - 400 K/uL   nRBC 0.0 0.0 - 0.2 %   Orthostatic vital signs:   Imaging:  No results found.  MDM & MAU COURSE  MDM: Moderate  MAU Course: -Vital signs within normal limits. -CBC and CMP. -EKG shows NSR. -CBC shows mild anemia, reasonable to start PO iron supplements. -CMP without significant abnormalities. -Negative orthostatic vital signs.  Differential diagnosis considered for syncope includes but is not limited to: anemia, dehydration, arrhythmia, vasovagal syncope, hypoglycemia, BPPV  Orders Placed This Encounter  Procedures   Comprehensive metabolic panel   CBC   AMB Referral to Cardio Obstetrics   Orthostatic vital signs   ED EKG   Discharge patient   Meds ordered this encounter  Medications   meclizine  (ANTIVERT ) 12.5 MG tablet    Sig: Take 1 tablet (12.5 mg total) by mouth 3 (three) times daily as needed for dizziness.    Dispense:  30 tablet    Refill:  0    ASSESSMENT   1. Postural dizziness with  presyncope   2. [redacted] weeks gestation of pregnancy     PLAN  Discharge home in stable condition with return precautions.  Refer to [facility] cardiology.   Follow-up Information     [facility] for [facility] Follow up.   Specialty: Obstetrics and Gynecology Why: As scheduled for ongoing  prenatal care Contact information: [address], [address] [place]  [address] [phone]                 Allergies as of 03/17/2024   No Known Allergies      Medication List     TAKE these medications    Blood Pressure Kit Devi 1 Device by Does not apply route once a week. Large cuff   Bonjesta  20-20 MG Tbcr Generic drug: Doxylamine-Pyridoxine ER Take 1 tablet by mouth at bedtime.   meclizine  12.5 MG tablet Commonly known as: ANTIVERT  Take 1 tablet (12.5 mg total) by mouth 3 (three) times daily as needed for dizziness.   ondansetron  4 MG disintegrating tablet Commonly known as: ZOFRAN -ODT Take 1 tablet (4 mg total) by mouth every 6 (six) hours as needed for nausea.   PNV Prenatal Plus Multivit+DHA 27-1 & 312 MG Misc Take 1 tablet by mouth daily.   promethazine  12.5 MG tablet Commonly known as: PHENERGAN  Take 1 tablet (12.5 mg total) by mouth every 6 (six) hours as needed for nausea or vomiting.   promethazine  25 MG tablet Commonly known as: PHENERGAN  Take 1 tablet (25 mg total) by mouth every 6 (six) hours as needed for nausea or vomiting.        Joesph DELENA Sear, PA

## 2024-03-19 ENCOUNTER — Encounter: Admitting: Obstetrics and Gynecology

## 2024-03-26 ENCOUNTER — Ambulatory Visit (INDEPENDENT_AMBULATORY_CARE_PROVIDER_SITE_OTHER): Admitting: Obstetrics and Gynecology

## 2024-03-26 VITALS — BP 125/71 | HR 90 | Wt 191.2 lb

## 2024-03-26 DIAGNOSIS — Z3A27 27 weeks gestation of pregnancy: Secondary | ICD-10-CM

## 2024-03-26 DIAGNOSIS — Z3401 Encounter for supervision of normal first pregnancy, first trimester: Secondary | ICD-10-CM

## 2024-03-26 NOTE — Progress Notes (Signed)
   PRENATAL VISIT NOTE  Subjective:  Sherry Franklin is a 18 y.o. G1P0000 at [redacted]w[redacted]d being seen today for ongoing prenatal care.  She is currently monitored for the following issues for this low-risk pregnancy and has Substance induced mood disorder (HCC); GAD (generalized anxiety disorder); Cannabis use disorder, mild, abuse; and Supervision of other normal pregnancy, antepartum on their problem list.  Patient reports feeling better since hospital visit.  Contractions: Not present. Vag. Bleeding: None.  Movement: Present. Denies leaking of fluid.   The following portions of the patient's history were reviewed and updated as appropriate: allergies, current medications, past family history, past medical history, past social history, past surgical history and problem list.   Objective:    Vitals:   03/26/24 1600  BP: 125/71  Pulse: 90  Weight: 191 lb 3.2 oz (86.7 kg)    Fetal Status:  Fetal Heart Rate (bpm): 139   Movement: Present    General: Alert, oriented and cooperative. Patient is in no acute distress.  Skin: Skin is warm and dry. No rash noted.   Cardiovascular: Normal heart rate noted  Respiratory: Normal respiratory effort, no problems with respiration noted  Abdomen: Soft, gravid, appropriate for gestational age.  Pain/Pressure: Present     Pelvic: Cervical exam deferred        Extremities: Normal range of motion.  Edema: None  Mental Status: Normal mood and affect. Normal behavior. Normal judgment and thought content.   Assessment and Plan:  Pregnancy: G1P0000 at [redacted]w[redacted]d 1. Encounter for supervision of normal first pregnancy in first trimester (Primary) BP and FHR normal Doing well, feeling regular movement  FH appropriate  2. [redacted] weeks gestation of pregnancy -Anticipatory guidance regarding GTT and labs next visit, discussed NPO status after midnight  -Discussed tdap, interested next visit -Peds list given -Planning pills for birth control    Preterm labor symptoms and  general obstetric precautions including but not limited to vaginal bleeding, contractions, leaking of fluid and fetal movement were reviewed in detail with the patient. Please refer to After Visit Summary for other counseling recommendations.   Return schedule 1 week lab visit GTT and then 2 weeks for LOB.  Future Appointments  Date Time Provider Department Center  04/01/2024  8:00 AM [facility] None  05/22/2024  3:20 PM Tobb, Kardie, DO [facility] None    Nidia Daring, FNP

## 2024-03-26 NOTE — Progress Notes (Signed)
 Pt presents for rob. Pt has no questions or concerns at this time.

## 2024-03-26 NOTE — Patient Instructions (Signed)
 [facility] Pediatric Providers  [place] ([id number])  [facility]) - [facility], MD; Manson Passey, MD; Ave Filter, MD; Luna Fuse, MD; Kennedy Bucker, MD; Florestine Avers, MD; Melchor Amour, MD; Yetta Barre,  MD; Konrad Dolores, MD; Kathlene November, MD; Jenne Campus, MD; Wynetta Emery, MD; Duffy Rhody, MD; Gerre Couch, NP [address] [place], [place], [place] [address] [phone] Mon, Tue, Thur, Fri 8:30-5:30, Wed 9:30-5:30, Sat 8:30-12:30 Only accepting infants of first-time parents or siblings of current patients Hospital discharge coordinator will make follow-up appointment Medicaid - yes; Tricare - yes   [facility] - Pediatrics at Elige Radon, MD; Sabino Dick, MD; Quitman Livings, MD; Betha Loa, NP; Claretha Cooper, MD; Lelon Perla, MD [address] [place] [place], [place] [address] [phone] Mon-Fri 8:30-5:30 Medicaid - yes, Tricare - yes  [place] [phone]) [facility], MD [address], [place], [place] [address] [phone] Mon, Tues, Wed Fri 8:30-5:00, Sat 8:30-12:00, Closed Thursdays Accepting siblings of established patients and first time mom's if you call prenatally Medicaid- yes; Tricare - yes   [facility] [address], [place], [place] [address] [phone] Mon-Fri 8:30-5:00 (lunch 12:00-1:00) Medicaid -Yes; Tricare - Yes   [facility] [place], MD; [place], [place]; [place], [place]; [place], [place] [address] [place] [address] [phone] Mon-Fri 7:30-5:30 Medicaid - Yes; Sherolyn Buba  [place] [phone] & [phone])  [facility], MD [address] [place], [place] [address] [phone] Mon-Thur 8:00-6:00, closed for lunch 12-2, closed Fridays Medicaid - yes; Tricare - no  [facility] Dareen Piano, NP; Cyndia Bent, MD; [place], [place]; [place], [place] [address], [place],  [place] [address] [phone] Mon-Fri 7:30-4:30 Medicaid - yes, Tricare - yes  [facility]  Juanito Doom, MD; Janene Harvey, NP; Vonita Moss, MD; Donn Pierini, NP [address], [place], [place] [address] [phone] Mon-Fri 8:30-5:00, closed for lunch 1-2, Sat 8:30-12:00 - sick visits only Providers come to see babies at [facility] Only accepting newborns of siblings and first time parents ONLY if who have met with office prior to delivery Medicaid -Yes; Tricare - yes  [facility] - [place], [place]; Spero Geralds, NP; Earlene Plater, MD; Lucretia Roers, MD:  [address], [place], [place] [address] [phone] Mon- Fri 8:00-5:00, Sat 9:00-12:00 - sick visits only Accepting siblings of established patients and first time mom/baby Medicaid - Yes; Tricare - yes Patients must have vaccinations (baby vaccines)   [employer] [phone])  [facility] Alfredo Bach, PA; [place], [place]; Eddie Candle, MD; Normand Sloop, MD; [place], NP; [place], DO; [place], [place]; Constance Goltz, MD; Ruthann Cancer, MD; Vear Clock, MD; [place], [place]; [place], [place]; [place], [place] [address] [facility] [address], [place], [place] [address] [phone] Mon-Fri 8:30-5:00, Sat 9:00-12:00 - sick only Please register online [email or web address] then schedule online or call office Medicaid-Yes; Tricare -yes   [facility] - Family Medicine at [place] ([facility] [place]) [place], [place]; [place], FNP; Berneda Rose, MD; Luther Redo, PA-C; Lavonia Drafts, MD; Kellie Simmering, FNP; Genevie Cheshire, FNP; Evaristo Bury, MD; Berneda Rose, MD [phone] [address] [place], [place] [address] [phone] Mon-Fri 8:30-5:30 Medicaid - Yes; Tricare - yes  [facility] - [address]  [place], [place]; Whitney Post, MD; Hennie Duos, MD; Wynne Dust, MD; [place], NP [address], [place], [place] [address] [phone] Mon-Thur 8:00-5:30, Fri 8:00-5:00, Sat 9:00-12:00 Medicaid - yes, Tricare - yes

## 2024-04-01 ENCOUNTER — Other Ambulatory Visit

## 2024-04-02 ENCOUNTER — Other Ambulatory Visit

## 2024-04-02 ENCOUNTER — Encounter: Payer: Self-pay | Admitting: Obstetrics

## 2024-04-02 ENCOUNTER — Ambulatory Visit (INDEPENDENT_AMBULATORY_CARE_PROVIDER_SITE_OTHER): Payer: Self-pay | Admitting: Obstetrics

## 2024-04-02 VITALS — BP 128/76 | HR 92 | Wt 193.0 lb

## 2024-04-02 DIAGNOSIS — Z3401 Encounter for supervision of normal first pregnancy, first trimester: Secondary | ICD-10-CM

## 2024-04-02 DIAGNOSIS — Z3403 Encounter for supervision of normal first pregnancy, third trimester: Secondary | ICD-10-CM

## 2024-04-02 DIAGNOSIS — Z3A28 28 weeks gestation of pregnancy: Secondary | ICD-10-CM

## 2024-04-02 NOTE — Progress Notes (Signed)
 Pt presents for ROB. No questions or concerns. Would like to defer TDap to next visit.

## 2024-04-02 NOTE — Progress Notes (Signed)
 Subjective:  Sherry Franklin is a 18 y.o. G1P0000 at [redacted]w[redacted]d being seen today for ongoing prenatal care.  She is currently monitored for the following issues for this low-risk pregnancy and has Substance induced mood disorder (HCC); GAD (generalized anxiety disorder); Cannabis use disorder, mild, abuse; and Supervision of other normal pregnancy, antepartum on their problem list.  Patient reports heartburn.  Contractions: Not present. Vag. Bleeding: None.  Movement: Present. Denies leaking of fluid.   The following portions of the patient's history were reviewed and updated as appropriate: allergies, current medications, past family history, past medical history, past social history, past surgical history and problem list. Problem list updated.  Objective:   Vitals:   04/02/24 0928  BP: 128/76  Pulse: 92  Weight: 193 lb (87.5 kg)    Fetal Status: Fetal Heart Rate (bpm): 146   Movement: Present     General:  Alert, oriented and cooperative. Patient is in no acute distress.  Skin: Skin is warm and dry. No rash noted.   Cardiovascular: Normal heart rate noted  Respiratory: Normal respiratory effort, no problems with respiration noted  Abdomen: Soft, gravid, appropriate for gestational age. Pain/Pressure: Absent     Pelvic:  Cervical exam deferred        Extremities: Normal range of motion.  Edema: None  Mental Status: Normal mood and affect. Normal behavior. Normal judgment and thought content.   Urinalysis:      Assessment and Plan:  Pregnancy: G1P0000 at [redacted]w[redacted]d  1. Encounter for supervision of normal first pregnancy in first trimester (Primary) Rx: - CBC - Glucose Tolerance, 2 Hours w/1 Hour - RPR - HIV Antibody (routine testing w rflx)   Preterm labor symptoms and general obstetric precautions including but not limited to vaginal bleeding, contractions, leaking of fluid and fetal movement were reviewed in detail with the patient. Please refer to After Visit Summary for other  counseling recommendations.   Return in about 2 weeks (around 04/16/2024) for ROB.   Rudy Carlin LABOR, MD 7/`24/2025

## 2024-04-03 ENCOUNTER — Ambulatory Visit: Payer: Self-pay | Admitting: Family Medicine

## 2024-04-03 DIAGNOSIS — Z3480 Encounter for supervision of other normal pregnancy, unspecified trimester: Secondary | ICD-10-CM

## 2024-04-03 LAB — GLUCOSE TOLERANCE, 2 HOURS W/ 1HR
Glucose, 1 hour: 111 mg/dL (ref 70–179)
Glucose, 2 hour: 97 mg/dL (ref 70–152)
Glucose, Fasting: 71 mg/dL (ref 70–91)

## 2024-04-03 LAB — CBC
Hematocrit: 31.8 % — ABNORMAL LOW (ref 34.0–46.6)
Hemoglobin: 10.3 g/dL — ABNORMAL LOW (ref 11.1–15.9)
MCH: 29.6 pg (ref 26.6–33.0)
MCHC: 32.4 g/dL (ref 31.5–35.7)
MCV: 91 fL (ref 79–97)
Platelets: 210 x10E3/uL (ref 150–450)
RBC: 3.48 x10E6/uL — ABNORMAL LOW (ref 3.77–5.28)
RDW: 13.1 % (ref 11.7–15.4)
WBC: 7.2 x10E3/uL (ref 3.4–10.8)

## 2024-04-03 LAB — HIV ANTIBODY (ROUTINE TESTING W REFLEX): HIV Screen 4th Generation wRfx: NONREACTIVE

## 2024-04-03 LAB — RPR: RPR Ser Ql: NONREACTIVE

## 2024-04-10 ENCOUNTER — Inpatient Hospital Stay (HOSPITAL_COMMUNITY)
Admission: AD | Admit: 2024-04-10 | Discharge: 2024-04-10 | Disposition: A | Discharge status: Home / Self Care | Attending: Obstetrics & Gynecology | Admitting: Obstetrics & Gynecology

## 2024-04-10 ENCOUNTER — Other Ambulatory Visit: Payer: Self-pay

## 2024-04-10 ENCOUNTER — Encounter (HOSPITAL_COMMUNITY): Payer: Self-pay | Admitting: Obstetrics & Gynecology

## 2024-04-10 DIAGNOSIS — O26893 Other specified pregnancy related conditions, third trimester: Secondary | ICD-10-CM

## 2024-04-10 DIAGNOSIS — O99891 Other specified diseases and conditions complicating pregnancy: Secondary | ICD-10-CM | POA: Insufficient documentation

## 2024-04-10 DIAGNOSIS — Z3A29 29 weeks gestation of pregnancy: Secondary | ICD-10-CM

## 2024-04-10 DIAGNOSIS — O99013 Anemia complicating pregnancy, third trimester: Secondary | ICD-10-CM | POA: Insufficient documentation

## 2024-04-10 DIAGNOSIS — D509 Iron deficiency anemia, unspecified: Secondary | ICD-10-CM | POA: Diagnosis not present

## 2024-04-10 DIAGNOSIS — R109 Unspecified abdominal pain: Secondary | ICD-10-CM | POA: Diagnosis present

## 2024-04-10 DIAGNOSIS — R55 Syncope and collapse: Secondary | ICD-10-CM | POA: Diagnosis not present

## 2024-04-10 LAB — CBC
HCT: 31.4 % — ABNORMAL LOW (ref 36.0–46.0)
Hemoglobin: 10.5 g/dL — ABNORMAL LOW (ref 12.0–15.0)
MCH: 28.9 pg (ref 26.0–34.0)
MCHC: 33.4 g/dL (ref 30.0–36.0)
MCV: 86.5 fL (ref 80.0–100.0)
Platelets: 220 K/uL (ref 150–400)
RBC: 3.63 MIL/uL — ABNORMAL LOW (ref 3.87–5.11)
RDW: 13.1 % (ref 11.5–15.5)
WBC: 6.4 K/uL (ref 4.0–10.5)
nRBC: 0 % (ref 0.0–0.2)

## 2024-04-10 LAB — FERRITIN: Ferritin: 7 ng/mL — ABNORMAL LOW (ref 11–307)

## 2024-04-10 LAB — URINALYSIS, ROUTINE W REFLEX MICROSCOPIC
Bilirubin Urine: NEGATIVE
Glucose, UA: NEGATIVE mg/dL
Hgb urine dipstick: NEGATIVE
Ketones, ur: NEGATIVE mg/dL
Nitrite: NEGATIVE
Protein, ur: 30 mg/dL — AB
Specific Gravity, Urine: 1.015 (ref 1.005–1.030)
pH: 6 (ref 5.0–8.0)

## 2024-04-10 LAB — COMPREHENSIVE METABOLIC PANEL WITH GFR
ALT: 14 U/L (ref 0–44)
AST: 17 U/L (ref 15–41)
Albumin: 2.5 g/dL — ABNORMAL LOW (ref 3.5–5.0)
Alkaline Phosphatase: 88 U/L (ref 38–126)
Anion gap: 8 (ref 5–15)
BUN: <5 mg/dL — ABNORMAL LOW (ref 6–20)
CO2: 19 mmol/L — ABNORMAL LOW (ref 22–32)
Calcium: 8.3 mg/dL — ABNORMAL LOW (ref 8.9–10.3)
Chloride: 107 mmol/L (ref 98–111)
Creatinine, Ser: 0.45 mg/dL (ref 0.44–1.00)
GFR, Estimated: >60 mL/min (ref >60)
Glucose, Bld: 99 mg/dL (ref 70–99)
Potassium: 3.5 mmol/L (ref 3.5–5.1)
Sodium: 134 mmol/L — ABNORMAL LOW (ref 135–145)
Total Bilirubin: 0.4 mg/dL (ref 0.0–1.2)
Total Protein: 6.6 g/dL (ref 6.5–8.1)

## 2024-04-10 LAB — VITAMIN B12: Vitamin B-12: 265 pg/mL (ref 180–914)

## 2024-04-10 LAB — RETICULOCYTES
Immature Retic Fract: 25.3 % — ABNORMAL HIGH (ref 2.3–15.9)
RBC.: 3.69 MIL/uL — ABNORMAL LOW (ref 3.87–5.11)
Retic Count, Absolute: 104.1 K/uL (ref 19.0–186.0)
Retic Ct Pct: 2.8 % (ref 0.4–3.1)

## 2024-04-10 LAB — IRON AND TIBC
Iron: 44 ug/dL (ref 28–170)
Saturation Ratios: 8 % — ABNORMAL LOW (ref 10.4–31.8)
TIBC: 532 ug/dL — ABNORMAL HIGH (ref 250–450)
UIBC: 488 ug/dL

## 2024-04-10 LAB — FOLATE: Folate: 7.4 ng/mL (ref >5.9)

## 2024-04-10 MED ORDER — SODIUM CHLORIDE 0.9 % IV SOLN
125.0000 mg | Freq: Once | INTRAVENOUS | Status: AC
  Administered 2024-04-10: 125 mg via INTRAVENOUS
  Filled 2024-04-10: qty 10

## 2024-04-10 NOTE — MAU Note (Signed)
 Aleatha Pollinger is a 18 y.o. at [redacted]w[redacted]d here in MAU reporting: yesterday stomach felt tight and uncomfortable that continued throughout the night. Abdomen feels sore to the touch but states it doesn't feel like cramps on Her entire abdomen. Reports on and off nausea with 1 episode of vomiting last night.  States she passed out at work on the way to the bathroom. States she came back to and was on the floor reports falling on her butt and does not think she hit her head when she fell. States this has never happened. Reports no change in daily routine and has had food and water today. Denies any vaginal bleeding or LOF. Reports +FM since the fall.   Onset of complaint: 0930 Pain score: 5, abdomen  Vitals:   04/10/24 1014 04/10/24 1015  BP: 134/70   Pulse: 92   Temp: 98 F (36.7 C)   SpO2:  100%     FHT:142 Lab orders placed from triage:

## 2024-04-10 NOTE — Discharge Instructions (Signed)
 You will be contacted by the infusion center for additional iron infusions

## 2024-04-10 NOTE — MAU Provider Note (Signed)
 Chief Complaint:  Abdominal Pain and Loss of Consciousness   HPI      Sherry Franklin is a 18 y.o. G1P0000 at [redacted]w[redacted]d who presents to maternity admissions reporting she had a syncopal episode at approximately 9 AM this morning that was witnessed.  Patient denies any dizziness lightheadedness or palpitations prior to the episode.  She has been seen in the MAU for similar symptoms and was prescribed meclizine  which she takes as necessary.  Patient is anemic and on iron supplements every other day.  Anemia panel was previously planned and patient also reports she has a cardiology appointment upcoming in September 2025. She reports she has been able to eat and drink without difficulty.  She offers no obstetrical complaints today.  She denies any vaginal bleeding, leaking of fluid, contractions, and reports good fetal movements  Pregnancy Course: [facility] - Pregnancy records reviewed  Past Medical History:  Diagnosis Date   Anxiety    Hidradenitis suppurativa    OB History  Gravida Para Term Preterm AB Living  1 0 0 0 0 0  SAB IAB Ectopic Multiple Live Births  0 0 0 0 0    # Outcome Date GA Lbr Len/2nd Weight Sex Type Anes PTL Lv  1 Current            Past Surgical History:  Procedure Laterality Date   NO PAST SURGERIES     Family History  Problem Relation Age of Onset   Cancer Maternal Grandmother    Cancer Other    Asthma Neg Hx    Diabetes Neg Hx    Heart disease Neg Hx    Hypertension Neg Hx    Social History   Tobacco Use   Smoking status: Never   Smokeless tobacco: Never  Vaping Use   Vaping status: Never Used  Substance Use Topics   Alcohol use: Not Currently   Drug use: Not Currently    Types: Marijuana    Comment: last  use feb 2025   No Known Allergies Medications Prior to Admission  Medication Sig Dispense Refill Last Dose/Taking   ferrous sulfate  325 (65 FE) MG tablet Take 1 tablet (325 mg total) by mouth every other day. 15 tablet 1 Past Week   meclizine   (ANTIVERT ) 12.5 MG tablet Take 1 tablet (12.5 mg total) by mouth 3 (three) times daily as needed for dizziness. 30 tablet 0 Past Week   Blood Pressure Monitoring (BLOOD PRESSURE KIT) DEVI 1 Device by Does not apply route once a week. Large cuff (Patient not taking: Reported on 04/02/2024) 1 each 0    Doxylamine-Pyridoxine ER (BONJESTA ) 20-20 MG TBCR Take 1 tablet by mouth at bedtime. (Patient not taking: Reported on 04/02/2024) 60 tablet 2    ondansetron  (ZOFRAN -ODT) 4 MG disintegrating tablet Take 1 tablet (4 mg total) by mouth every 6 (six) hours as needed for nausea. (Patient not taking: Reported on 04/02/2024) 20 tablet 0    Prenatal Vit-Fe Fum-FA-Omega (PNV PRENATAL PLUS MULTIVIT+DHA) 27-1 & 312 MG MISC Take 1 tablet by mouth daily. (Patient not taking: Reported on 04/02/2024) 30 each 12    promethazine  (PHENERGAN ) 12.5 MG tablet Take 1 tablet (12.5 mg total) by mouth every 6 (six) hours as needed for nausea or vomiting. (Patient not taking: Reported on 04/02/2024) 30 tablet 0    promethazine  (PHENERGAN ) 25 MG tablet Take 1 tablet (25 mg total) by mouth every 6 (six) hours as needed for nausea or vomiting. (Patient not taking: Reported on 04/02/2024) 30  tablet 1     I have reviewed patient's Past Medical Hx, Surgical Hx, Family Hx, Social Hx, medications and allergies.   ROS  Pertinent items noted in HPI and remainder of comprehensive ROS otherwise negative.   PHYSICAL EXAM  Patient Vitals for the past 24 hrs:  BP Temp Temp src Pulse SpO2 Height Weight  04/10/24 1415 -- -- -- -- 100 % -- --  04/10/24 1410 -- -- -- -- 100 % -- --  04/10/24 1405 -- -- -- -- 100 % -- --  04/10/24 1400 -- -- -- -- 100 % -- --  04/10/24 1355 -- -- -- -- 100 % -- --  04/10/24 1350 -- -- -- -- 100 % -- --  04/10/24 1345 -- -- -- -- 100 % -- --  04/10/24 1340 119/71 -- -- 80 100 % -- --  04/10/24 1215 -- -- -- -- 98 % -- --  04/10/24 1210 -- -- -- -- 98 % -- --  04/10/24 1205 -- -- -- -- 99 % -- --  04/10/24 1200  -- -- -- -- 98 % -- --  04/10/24 1155 -- -- -- -- 99 % -- --  04/10/24 1150 -- -- -- -- 100 % -- --  04/10/24 1145 -- -- -- -- 96 % -- --  04/10/24 1140 -- -- -- -- 100 % -- --  04/10/24 1135 -- -- -- -- 100 % -- --  04/10/24 1130 -- -- -- -- 99 % -- --  04/10/24 1111 133/76 -- -- (!) 121 100 % -- --  04/10/24 1108 116/61 -- -- (!) 110 -- -- --  04/10/24 1107 113/65 -- -- 95 -- -- --  04/10/24 1105 117/67 -- -- 94 100 % -- --  04/10/24 1100 -- -- -- -- 100 % -- --  04/10/24 1055 -- -- -- -- 99 % -- --  04/10/24 1050 -- -- -- -- 98 % -- --  04/10/24 1040 -- -- -- -- 100 % -- --  04/10/24 1035 -- -- -- -- 100 % -- --  04/10/24 1030 -- -- -- -- 100 % -- --  04/10/24 1025 -- -- -- -- 100 % -- --  04/10/24 1020 -- -- -- -- 100 % -- --  04/10/24 1015 -- -- -- -- 100 % 5' 9 (1.753 m) 83.5 kg  04/10/24 1014 134/70 98 F (36.7 C) Oral 92 -- -- --    Constitutional: Well-developed, well-nourished female in no acute distress, no pallor, no juandice Cardiovascular: normal rate & rhythm, warm and well-perfused Respiratory: normal effort, no problems with respiration noted, Lungs BCTA GI: Abd soft, non-tender, gravid MS: Extremities nontender, no edema, normal ROM Neurologic: Alert and oriented x 4.  GU: no CVA tenderness Pelvic: Deferred      Fetal Tracing: Cat 1 reactive for GA Baseline: 140 Variability: moderate Accelerations: present Decelerations: absent Toco: no ctx's   Labs: Results for orders placed or performed during the hospital encounter of 04/10/24 (from the past 24 hours)  CBC     Status: Abnormal   Collection Time: 04/10/24 10:52 AM  Result Value Ref Range   WBC 6.4 4.0 - 10.5 K/uL   RBC 3.63 (L) 3.87 - 5.11 MIL/uL   Hemoglobin 10.5 (L) 12.0 - 15.0 g/dL   HCT 68.5 (L) 63.9 - 53.9 %   MCV 86.5 80.0 - 100.0 fL   MCH 28.9 26.0 - 34.0 pg   MCHC 33.4 30.0 - 36.0 g/dL  RDW 13.1 11.5 - 15.5 %   Platelets 220 150 - 400 K/uL   nRBC 0.0 0.0 - 0.2 %  Comprehensive  metabolic panel     Status: Abnormal   Collection Time: 04/10/24 10:52 AM  Result Value Ref Range   Sodium 134 (L) 135 - 145 mmol/L   Potassium 3.5 3.5 - 5.1 mmol/L   Chloride 107 98 - 111 mmol/L   CO2 19 (L) 22 - 32 mmol/L   Glucose, Bld 99 70 - 99 mg/dL   BUN <5 (L) 6 - 20 mg/dL   Creatinine, Ser 9.54 0.44 - 1.00 mg/dL   Calcium 8.3 (L) 8.9 - 10.3 mg/dL   Total Protein 6.6 6.5 - 8.1 g/dL   Albumin 2.5 (L) 3.5 - 5.0 g/dL   AST 17 15 - 41 U/L   ALT 14 0 - 44 U/L   Alkaline Phosphatase 88 38 - 126 U/L   Total Bilirubin 0.4 0.0 - 1.2 mg/dL   GFR, Estimated >39 >39 mL/min   Anion gap 8 5 - 15  Vitamin B12     Status: None   Collection Time: 04/10/24 10:52 AM  Result Value Ref Range   Vitamin B-12 265 180 - 914 pg/mL  Folate     Status: None   Collection Time: 04/10/24 10:52 AM  Result Value Ref Range   Folate 7.4 >5.9 ng/mL  Iron and TIBC     Status: Abnormal   Collection Time: 04/10/24 10:52 AM  Result Value Ref Range   Iron 44 28 - 170 ug/dL   TIBC 467 (H) 749 - 549 ug/dL   Saturation Ratios 8 (L) 10.4 - 31.8 %   UIBC 488 ug/dL  Ferritin     Status: Abnormal   Collection Time: 04/10/24 10:52 AM  Result Value Ref Range   Ferritin 7 (L) 11 - 307 ng/mL  Reticulocytes     Status: Abnormal   Collection Time: 04/10/24 10:52 AM  Result Value Ref Range   Retic Ct Pct 2.8 0.4 - 3.1 %   RBC. 3.69 (L) 3.87 - 5.11 MIL/uL   Retic Count, Absolute 104.1 19.0 - 186.0 K/uL   Immature Retic Fract 25.3 (H) 2.3 - 15.9 %  Urinalysis, Routine w reflex microscopic -Urine, Random     Status: Abnormal   Collection Time: 04/10/24 12:10 PM  Result Value Ref Range   Color, Urine YELLOW YELLOW   APPearance CLOUDY (A) CLEAR   Specific Gravity, Urine 1.015 1.005 - 1.030   pH 6.0 5.0 - 8.0   Glucose, UA NEGATIVE NEGATIVE mg/dL   Hgb urine dipstick NEGATIVE NEGATIVE   Bilirubin Urine NEGATIVE NEGATIVE   Ketones, ur NEGATIVE NEGATIVE mg/dL   Protein, ur 30 (A) NEGATIVE mg/dL   Nitrite NEGATIVE  NEGATIVE   Leukocytes,Ua SMALL (A) NEGATIVE   RBC / HPF 0-5 0 - 5 RBC/hpf   WBC, UA 0-5 0 - 5 WBC/hpf   Bacteria, UA FEW (A) NONE SEEN   Squamous Epithelial / HPF 21-50 0 - 5 /HPF   Mucus PRESENT     Imaging:  No results found.  MDM & MAU COURSE  MDM:  HIGH  Prenatal records reviewed Physical exam performed CBC, CMP, UA ordered CBC consistent with known anemia (patient currently on iron supplement) CMP unremarkable Anemia panel ordered, as  previously recommended: Ferritin at 7 consistent with iron deficiency anemia-will plan for outpatient infusion therapy for iron supplementation IV Iron ordered here in MAU (1st infusion here since symptomatic) This  plan discussed with Dr Ozan [facility] Attending) UA: Negative EKG: Sinus tachycardia otherwise normal EKG Orthostatic blood pressures -slight change noted from lying to standing with elevated heart rate which may be consistent with POTS syndrome (not diagnostic at this time, patient has OB cardiology follow-up scheduled)    Orthostatic VS for the past 24 hrs (Last 3 readings):  BP- Lying Pulse- Lying BP- Sitting Pulse- Sitting BP- Standing at 0 minutes Pulse- Standing at 0 minutes BP- Standing at 3 minutes Pulse- Standing at 3 minutes  04/10/24 1111 117/67 94 113/65 95 116/61 110 133/76 121    Meds ordered this encounter  Medications   ferric gluconate (FERRLECIT) 125 mg in sodium chloride  0.9 % 100 mL IVPB    My patient has the following indication for IV iron use and meets the criteria for inpatient administration::   Symptomatic anemia with Hb < 13 g/dL for men, Hb < 12 g/dL for women and at least one of the following: ferritin < 30 (if no inflammatory state), < 100 (if inflammatory state), or < 200 (dialysis patients); transferrin saturation < 20%      MAU Course: Orders Placed This Encounter  Procedures   CBC   Comprehensive metabolic panel   Vitamin B12   Folate   Iron and TIBC   Ferritin   Reticulocytes   Urinalysis,  Routine w reflex microscopic -Urine, Random   Orthostatic vital signs   EKG 12-Lead   Discharge patient Discharge disposition: 01-Home or Self Care; Discharge patient date: 04/10/2024     I have reviewed the patient chart and performed the physical exam . I have ordered & interpreted the lab results and reviewed and interpreted the NST Medications ordered as stated below.  A/P as described below.  Counseling and education provided and patient agreeable  with plan as described below. Verbalized understanding.    ASSESSMENT   1. Iron deficiency anemia, unspecified iron deficiency anemia type   2. Postural syncope   3. [redacted] weeks gestation of pregnancy      PLAN  Discharge home in stable condition with return precautions.   F/U with OB as scheduled  IV Iron outpatient  infusions to be scheduled   See AVS for full description of information given to the patient including both verbal and written. Patient verbalized understanding and agrees with the plan as described above.     Follow-up Information     [facility] for [facility] Follow up.   Specialty: Obstetrics and Gynecology Why: If symptoms worsen or fail to resolve, As scheduled for ongoing prenatal care Contact information: [address], [address] [place]  [phone]                Allergies as of 04/10/2024   No Known Allergies      Medication List     TAKE these medications    Blood Pressure Kit Devi 1 Device by Does not apply route once a week. Large cuff   Bonjesta  20-20 MG Tbcr Generic drug: Doxylamine-Pyridoxine ER Take 1 tablet by mouth at bedtime.   ferrous sulfate  325 (65 FE) MG tablet Take 1 tablet (325 mg total) by mouth every other day.   meclizine  12.5 MG tablet Commonly known as: ANTIVERT  Take 1 tablet (12.5 mg total) by mouth 3 (three) times daily as needed for dizziness.   ondansetron  4 MG disintegrating tablet Commonly known  as: ZOFRAN -ODT Take 1 tablet (4 mg total) by mouth every  6 (six) hours as needed for nausea.   PNV Prenatal Plus Multivit+DHA 27-1 & 312 MG Misc Take 1 tablet by mouth daily.   promethazine  12.5 MG tablet Commonly known as: PHENERGAN  Take 1 tablet (12.5 mg total) by mouth every 6 (six) hours as needed for nausea or vomiting.   promethazine  25 MG tablet Commonly known as: PHENERGAN  Take 1 tablet (25 mg total) by mouth every 6 (six) hours as needed for nausea or vomiting.        Olam Dalton, MSN, [facility], Center for [employer]

## 2024-04-16 ENCOUNTER — Other Ambulatory Visit (HOSPITAL_COMMUNITY): Payer: Self-pay | Admitting: Obstetrics & Gynecology

## 2024-04-16 ENCOUNTER — Telehealth: Payer: Self-pay

## 2024-04-16 ENCOUNTER — Ambulatory Visit: Admitting: Obstetrics & Gynecology

## 2024-04-16 VITALS — BP 126/77 | HR 97 | Wt 196.3 lb

## 2024-04-16 DIAGNOSIS — Z23 Encounter for immunization: Secondary | ICD-10-CM | POA: Diagnosis not present

## 2024-04-16 DIAGNOSIS — O99013 Anemia complicating pregnancy, third trimester: Secondary | ICD-10-CM

## 2024-04-16 DIAGNOSIS — Z3A30 30 weeks gestation of pregnancy: Secondary | ICD-10-CM

## 2024-04-16 DIAGNOSIS — Z3480 Encounter for supervision of other normal pregnancy, unspecified trimester: Secondary | ICD-10-CM

## 2024-04-16 NOTE — Progress Notes (Signed)
 Pt presents for ROB. Received iron transfusions at the hospital; never received call for outpatient transfusions. Would like Tdap today.

## 2024-04-16 NOTE — Progress Notes (Signed)
   PRENATAL VISIT NOTE  Subjective:  Sherry Franklin is a 18 y.o. G1P0000 at [redacted]w[redacted]d being seen today for ongoing prenatal care.  She is currently monitored for the following issues for this high-risk pregnancy and has Substance induced mood disorder (HCC); GAD (generalized anxiety disorder); Cannabis use disorder, mild, abuse; Supervision of other normal pregnancy, antepartum; and Anemia affecting pregnancy in third trimester on their problem list.  Patient reports no complaints.  Contractions: Not present. Vag. Bleeding: None.  Movement: Present. Denies leaking of fluid.   The following portions of the patient's history were reviewed and updated as appropriate: allergies, current medications, past family history, past medical history, past social history, past surgical history and problem list.   Objective:    Vitals:   04/16/24 0940  BP: 126/77  Pulse: 97  Weight: 196 lb 4.8 oz (89 kg)    Fetal Status:  Fetal Heart Rate (bpm): 138   Movement: Present    General: Alert, oriented and cooperative. Patient is in no acute distress.  Skin: Skin is warm and dry. No rash noted.   Cardiovascular: Normal heart rate noted  Respiratory: Normal respiratory effort, no problems with respiration noted  Abdomen: Soft, gravid, appropriate for gestational age.  Pain/Pressure: Absent     Pelvic: Cervical exam deferred        Extremities: Normal range of motion.  Edema: None  Mental Status: Normal mood and affect. Normal behavior. Normal judgment and thought content.   Assessment and Plan:  Pregnancy: G1P0000 at [redacted]w[redacted]d 1. Supervision of other normal pregnancy, antepartum (Primary)  - Tdap vaccine greater than or equal to 7yo IM  2. [redacted] weeks gestation of pregnancy   3. Anemia affecting pregnancy in third trimester Needs second iron infusion, infusion plan ordered  Preterm labor symptoms and general obstetric precautions including but not limited to vaginal bleeding, contractions, leaking of fluid  and fetal movement were reviewed in detail with the patient. Please refer to After Visit Summary for other counseling recommendations.   Return in about 2 weeks (around 04/30/2024).  Future Appointments  Date Time Provider Department Center  04/30/2024  9:35 AM Abigail, Rollo DASEN, MD [facility] None  05/22/2024  3:20 PM Tobb, Kardie, DO [facility] None    Lynwood Solomons, MD

## 2024-04-16 NOTE — Telephone Encounter (Addendum)
 Dr. Eveline, patient will be scheduled as soon as possible.  Auth Submission: NO AUTH NEEDED Site of care: Site of care: [facility] Payer: [place] medicaid Medication & CPT/J Code(s) submitted: Venofer (Iron Sucrose) J1756 Diagnosis Code:  Route of submission (phone, fax, portal):  Phone # Fax # Auth type: Buy/Bill PB Units/visits requested: 200mg  x 5 doses Reference number:  Approval from: 04/16/24 to 08/16/24

## 2024-04-20 ENCOUNTER — Inpatient Hospital Stay (HOSPITAL_COMMUNITY): Admission: RE | Admit: 2024-04-20 | Source: Ambulatory Visit

## 2024-04-22 ENCOUNTER — Encounter (HOSPITAL_COMMUNITY)

## 2024-04-22 ENCOUNTER — Inpatient Hospital Stay (HOSPITAL_COMMUNITY): Admission: RE | Admit: 2024-04-22 | Source: Ambulatory Visit

## 2024-04-24 ENCOUNTER — Encounter (HOSPITAL_COMMUNITY)

## 2024-04-27 ENCOUNTER — Encounter (HOSPITAL_COMMUNITY)

## 2024-04-28 ENCOUNTER — Encounter (HOSPITAL_COMMUNITY)

## 2024-04-29 ENCOUNTER — Encounter (HOSPITAL_COMMUNITY)

## 2024-04-30 ENCOUNTER — Encounter (HOSPITAL_COMMUNITY)

## 2024-04-30 ENCOUNTER — Ambulatory Visit: Admitting: Obstetrics and Gynecology

## 2024-04-30 VITALS — BP 111/73 | HR 74 | Wt 201.0 lb

## 2024-04-30 DIAGNOSIS — Z3A32 32 weeks gestation of pregnancy: Secondary | ICD-10-CM

## 2024-04-30 DIAGNOSIS — O99013 Anemia complicating pregnancy, third trimester: Secondary | ICD-10-CM

## 2024-04-30 DIAGNOSIS — Z3480 Encounter for supervision of other normal pregnancy, unspecified trimester: Secondary | ICD-10-CM

## 2024-04-30 NOTE — Progress Notes (Signed)
 Pt states she woke up yesterday morning and hands were swollen, did go down after a few hours - also having numbness and tingling. Advise she may try wrist splints.

## 2024-04-30 NOTE — Progress Notes (Signed)
   PRENATAL VISIT NOTE  Subjective:  Sherry Franklin is a 18 y.o. G1P0000 at [redacted]w[redacted]d being seen today for ongoing prenatal care.  She is currently monitored for the following issues for this low-risk pregnancy and has Substance induced mood disorder (HCC); GAD (generalized anxiety disorder); Cannabis use disorder, mild, abuse; Supervision of other normal pregnancy, antepartum; and Anemia affecting pregnancy in third trimester on their problem list.  Patient reports no complaints apart from transient swelling in hands. Contractions: Not present. Vag. Bleeding: None.  Movement: Present. Denies leaking of fluid.   The following portions of the patient's history were reviewed and updated as appropriate: allergies, current medications, past family history, past medical history, past social history, past surgical history and problem list.   Objective:   Vitals:   04/30/24 0950  BP: 111/73  Pulse: 74  Weight: 201 lb (91.2 kg)   Body mass index is 29.68 kg/m. Total weight gain: Not found.   Fetal Status: Fetal Heart Rate (bpm): 130 Fundal Height: 32 cm Movement: Present     General:  Alert, oriented and cooperative. Patient is in no acute distress.  Skin: Skin is warm and dry. No rash noted.   Cardiovascular: Normal heart rate noted  Respiratory: Normal respiratory effort, no problems with respiration noted  Abdomen: Soft, gravid, appropriate for gestational age.  Pain/Pressure: Present     Pelvic: Cervical exam deferred        Extremities: Normal range of motion.     Mental Status: Normal mood and affect. Normal behavior. Normal judgment and thought content.   Assessment and Plan:  Pregnancy: G1P0000 at [redacted]w[redacted]d 1. Supervision of other normal pregnancy, antepartum (Primary) Anticipatory guidance  2. Anemia affecting pregnancy in third trimester S/p iron infusions Recheck CBC at 36 wk appt, last infusion was last week  Preterm labor symptoms and general obstetric precautions including but  not limited to vaginal bleeding, contractions, leaking of fluid and fetal movement were reviewed in detail with the patient. Please refer to After Visit Summary for other counseling recommendations.   Return in about 2 weeks (around 05/14/2024).  Future Appointments  Date Time Provider Department Center  05/22/2024  3:20 PM Tobb, Kardie, DO [facility] None    Rollo ONEIDA Bring, MD

## 2024-05-01 ENCOUNTER — Encounter (HOSPITAL_COMMUNITY)

## 2024-05-07 ENCOUNTER — Encounter (HOSPITAL_COMMUNITY): Payer: Self-pay | Admitting: Obstetrics and Gynecology

## 2024-05-07 ENCOUNTER — Inpatient Hospital Stay (HOSPITAL_COMMUNITY)

## 2024-05-07 ENCOUNTER — Inpatient Hospital Stay (HOSPITAL_BASED_OUTPATIENT_CLINIC_OR_DEPARTMENT_OTHER)

## 2024-05-07 ENCOUNTER — Inpatient Hospital Stay (HOSPITAL_COMMUNITY)
Admission: AD | Admit: 2024-05-07 | Discharge: 2024-05-07 | Disposition: A | Discharge status: Home / Self Care | Attending: Obstetrics and Gynecology | Admitting: Obstetrics and Gynecology

## 2024-05-07 ENCOUNTER — Other Ambulatory Visit: Payer: Self-pay

## 2024-05-07 DIAGNOSIS — Z0371 Encounter for suspected problem with amniotic cavity and membrane ruled out: Secondary | ICD-10-CM | POA: Diagnosis present

## 2024-05-07 DIAGNOSIS — O4393 Unspecified placental disorder, third trimester: Secondary | ICD-10-CM | POA: Diagnosis not present

## 2024-05-07 DIAGNOSIS — Z3A33 33 weeks gestation of pregnancy: Secondary | ICD-10-CM | POA: Diagnosis not present

## 2024-05-07 DIAGNOSIS — O4703 False labor before 37 completed weeks of gestation, third trimester: Secondary | ICD-10-CM | POA: Insufficient documentation

## 2024-05-07 LAB — URINALYSIS, ROUTINE W REFLEX MICROSCOPIC
Bilirubin Urine: NEGATIVE
Glucose, UA: NEGATIVE mg/dL
Hgb urine dipstick: NEGATIVE
Ketones, ur: NEGATIVE mg/dL
Leukocytes,Ua: NEGATIVE
Nitrite: NEGATIVE
Protein, ur: NEGATIVE mg/dL
Specific Gravity, Urine: 1.012 (ref 1.005–1.030)
pH: 8 (ref 5.0–8.0)

## 2024-05-07 LAB — CBC
HCT: 36.2 % (ref 36.0–46.0)
Hemoglobin: 11.8 g/dL — ABNORMAL LOW (ref 12.0–15.0)
MCH: 28.2 pg (ref 26.0–34.0)
MCHC: 32.6 g/dL (ref 30.0–36.0)
MCV: 86.6 fL (ref 80.0–100.0)
Platelets: 218 K/uL (ref 150–400)
RBC: 4.18 MIL/uL (ref 3.87–5.11)
RDW: 14.9 % (ref 11.5–15.5)
WBC: 9 K/uL (ref 4.0–10.5)
nRBC: 0 % (ref 0.0–0.2)

## 2024-05-07 LAB — RPR: RPR Ser Ql: NONREACTIVE

## 2024-05-07 LAB — TYPE AND SCREEN
ABO/RH(D): B POS
Antibody Screen: NEGATIVE

## 2024-05-07 LAB — RUPTURE OF MEMBRANE (ROM)PLUS: Rom Plus: NEGATIVE

## 2024-05-07 LAB — POCT FERN TEST: POCT Fern Test: NEGATIVE

## 2024-05-07 MED ORDER — LACTATED RINGERS IV BOLUS
1000.0000 mL | Freq: Once | INTRAVENOUS | Status: AC
  Administered 2024-05-07: 1000 mL via INTRAVENOUS

## 2024-05-07 NOTE — MAU Provider Note (Signed)
 History     CSN: [id number]  Arrival date and time: 05/07/24 0943   None     Chief Complaint  Patient presents with   Abdominal Pain   Rupture of Membranes    0920   HPI Patient is a 18 year old G1 at 33 weeks 1 day presenting for leaking of fluids.  Reports that she was contracting all day yesterday off and on.  Reports that the contractions continued this morning.  She was on her way to work with her mother when she felt severe pain and then noticed a sudden gush of fluid.  Mother reports that it soaked her car seat.  Reports she is really uncomfortable at this time.  Denies any bleeding.  Reports normal discharge.  OB History     Gravida  1   Para  0   Term  0   Preterm  0   AB  0   Living  0      SAB  0   IAB  0   Ectopic  0   Multiple  0   Live Births  0           Past Medical History:  Diagnosis Date   Anxiety    Hidradenitis suppurativa     Past Surgical History:  Procedure Laterality Date   NO PAST SURGERIES      Family History  Problem Relation Age of Onset   Cancer Maternal Grandmother    Cancer Other    Asthma Neg Hx    Diabetes Neg Hx    Heart disease Neg Hx    Hypertension Neg Hx     Social History   Tobacco Use   Smoking status: Never   Smokeless tobacco: Never  Vaping Use   Vaping status: Never Used  Substance Use Topics   Alcohol use: Not Currently   Drug use: Not Currently    Types: Marijuana    Comment: last  use feb 2025    Allergies: No Known Allergies  Medications Prior to Admission  Medication Sig Dispense Refill Last Dose/Taking   ferrous sulfate  325 (65 FE) MG EC tablet Take 325 mg by mouth 3 (three) times daily with meals.   05/06/2024   Blood Pressure Monitoring (BLOOD PRESSURE KIT) DEVI 1 Device by Does not apply route once a week. Large cuff (Patient not taking: Reported on 04/02/2024) 1 each 0    Doxylamine-Pyridoxine ER (BONJESTA ) 20-20 MG TBCR Take 1 tablet by mouth at bedtime. (Patient not taking:  Reported on 04/02/2024) 60 tablet 2    ondansetron  (ZOFRAN -ODT) 4 MG disintegrating tablet Take 1 tablet (4 mg total) by mouth every 6 (six) hours as needed for nausea. (Patient not taking: Reported on 04/02/2024) 20 tablet 0    Prenatal Vit-Fe Fum-FA-Omega (PNV PRENATAL PLUS MULTIVIT+DHA) 27-1 & 312 MG MISC Take 1 tablet by mouth daily. (Patient not taking: Reported on 04/02/2024) 30 each 12    promethazine  (PHENERGAN ) 12.5 MG tablet Take 1 tablet (12.5 mg total) by mouth every 6 (six) hours as needed for nausea or vomiting. (Patient not taking: Reported on 04/02/2024) 30 tablet 0    promethazine  (PHENERGAN ) 25 MG tablet Take 1 tablet (25 mg total) by mouth every 6 (six) hours as needed for nausea or vomiting. (Patient not taking: Reported on 04/02/2024) 30 tablet 1     Review of Systems  Gastrointestinal:  Positive for abdominal pain.  Genitourinary:  Positive for vaginal discharge. Negative for vaginal bleeding.  All other  systems reviewed and are negative.  Physical Exam   Blood pressure 132/80, pulse (!) 121, temperature 98.1 F (36.7 C), temperature source Oral, resp. rate 16, last menstrual period 09/18/2023, SpO2 99%.  Physical Exam Vitals and nursing note reviewed.  Constitutional:      Appearance: Normal appearance.  HENT:     Head: Normocephalic and atraumatic.     Nose: No congestion or rhinorrhea.  Eyes:     Extraocular Movements: Extraocular movements intact.  Cardiovascular:     Rate and Rhythm: Normal rate.  Pulmonary:     Effort: Pulmonary effort is normal.  Abdominal:     Palpations: Abdomen is soft.     Tenderness: There is no abdominal tenderness.  Genitourinary:    Comments: SVE fingertip/thick/-3 Musculoskeletal:        General: Normal range of motion.     Cervical back: Normal range of motion.  Skin:    General: Skin is warm.     Capillary Refill: Capillary refill takes less than 2 seconds.  Neurological:     General: No focal deficit present.     Mental  Status: She is alert.     Cranial Nerves: No cranial nerve deficit.  Psychiatric:        Mood and Affect: Mood normal.        Behavior: Behavior normal.     MAU Course  Procedures  MDM CBC NST Urinalysis Fern ROM plus  Assessment and Plan  Sherry Franklin is a 18 yo G1 @ [redacted]w[redacted]d presenting with concern for rupture membranes.  Leaking of fluid Contractions Patient presenting after having large gush of fluid while riding in the car with her mom.  Ferning was negative.  ROM plus was also negative.  NST initially showing occasional variable decelerations.  Also bedside ultrasound challenging but limited fluid appreciated.  BPP completed 8/8 with normal AFI of 12.9.  Patient was still feeling some pressure but no regular contractions.  Discussed return precautions.  Discussed kick counts.  Patient discharged home.  Peterson Mathey V Lei Dower 05/07/2024, 11:02 AM

## 2024-05-07 NOTE — MAU Note (Signed)
 Sherry Franklin is a 18 y.o. at [redacted]w[redacted]d here in MAU reporting: leaking of fluid at 0920 and contractions started. Triage bypass  10/10 when they come. NO c/o vaginal bleeding, contractions, HA, chest pain, or visual disturbances. EFM explained and applied to soft non tender abd. Physical begun.  +FM   Onset of complaint: 0920 Pain score: 10/10 Vitals:   05/07/24 0956  BP: 132/80  Pulse: (!) 121  Resp: 16  Temp: 98.1 F (36.7 C)  SpO2: 99%     FHT: 165  Lab orders placed from triage: fern

## 2024-05-11 ENCOUNTER — Inpatient Hospital Stay (HOSPITAL_COMMUNITY)
Admission: AD | Admit: 2024-05-11 | Discharge: 2024-05-11 | Disposition: A | Discharge status: Home / Self Care | Attending: Obstetrics & Gynecology | Admitting: Obstetrics & Gynecology

## 2024-05-11 ENCOUNTER — Encounter (HOSPITAL_COMMUNITY): Payer: Self-pay | Admitting: Obstetrics & Gynecology

## 2024-05-11 DIAGNOSIS — O368130 Decreased fetal movements, third trimester, not applicable or unspecified: Secondary | ICD-10-CM | POA: Diagnosis present

## 2024-05-11 DIAGNOSIS — N949 Unspecified condition associated with female genital organs and menstrual cycle: Secondary | ICD-10-CM

## 2024-05-11 DIAGNOSIS — Z3A33 33 weeks gestation of pregnancy: Secondary | ICD-10-CM

## 2024-05-11 DIAGNOSIS — O479 False labor, unspecified: Secondary | ICD-10-CM | POA: Diagnosis not present

## 2024-05-11 DIAGNOSIS — Z3480 Encounter for supervision of other normal pregnancy, unspecified trimester: Secondary | ICD-10-CM

## 2024-05-11 DIAGNOSIS — O4703 False labor before 37 completed weeks of gestation, third trimester: Secondary | ICD-10-CM | POA: Insufficient documentation

## 2024-05-11 LAB — FETAL FIBRONECTIN: Fetal Fibronectin: POSITIVE — AB

## 2024-05-11 MED ORDER — OXYCODONE HCL 5 MG PO TABS: 5.0000 mg | ORAL_TABLET | Freq: Three times a day (TID) | ORAL | 0 refills | Status: AC | PRN

## 2024-05-11 MED ORDER — ACETAMINOPHEN 500 MG PO TABS: 1000.0000 mg | ORAL_TABLET | Freq: Once | ORAL | Status: DC

## 2024-05-11 NOTE — MAU Note (Signed)
 Pt says last movement was at [place] -  Today - she drank cold water, walking , rubbing abd . Was here on Thursday  for UC's - VE- 1 cm-  then had spotting  [facility]- [facility] Last sex- Sat   Feels UC's -mild reg .

## 2024-05-11 NOTE — MAU Provider Note (Signed)
 History     CSN: [id number]  Arrival date and time: 05/11/24 1940   Event Date/Time   First Provider Initiated Contact with Patient 05/11/24 2041      Chief Complaint  Patient presents with   Decreased Fetal Movement   Patient is a 18 y/o G1P0 at 104w6d who presents to MAU for decreased fetal movement as well as mild contractions.  Of note patient was evaluated on 05/07/2024 for possible rupture of membranes.  At that time ferning was negative, ROM plus was negative.  BPP was completed at that time which was 8 out of 8.  She states that last fetal movement was around midnight.  She tried drinking cold water, walking, and rubbing her abdomen.  She also had a little spotting.  Contractions have spaced out become irregular.  She denies leakage of fluid. Endorses vaginal intercourse Saturday evening.    Past Medical History:  Diagnosis Date   Anxiety    Hidradenitis suppurativa     Past Surgical History:  Procedure Laterality Date   NO PAST SURGERIES      Family History  Problem Relation Age of Onset   Cancer Maternal Grandmother    Cancer Other    Asthma Neg Hx    Diabetes Neg Hx    Heart disease Neg Hx    Hypertension Neg Hx     Social History   Tobacco Use   Smoking status: Never   Smokeless tobacco: Never  Vaping Use   Vaping status: Never Used  Substance Use Topics   Alcohol use: Not Currently   Drug use: Not Currently    Types: Marijuana    Comment: last  use feb 2025    Allergies: No Known Allergies  Medications Prior to Admission  Medication Sig Dispense Refill Last Dose/Taking   ferrous sulfate  325 (65 FE) MG EC tablet Take 325 mg by mouth 3 (three) times daily with meals.   05/11/2024   Prenatal Vit-Fe Fum-FA-Omega (PNV PRENATAL PLUS MULTIVIT+DHA) 27-1 & 312 MG MISC Take 1 tablet by mouth daily. 30 each 12 05/10/2024   Blood Pressure Monitoring (BLOOD PRESSURE KIT) DEVI 1 Device by Does not apply route once a week. Large cuff (Patient not taking: Reported  on 04/02/2024) 1 each 0    Doxylamine-Pyridoxine ER (BONJESTA ) 20-20 MG TBCR Take 1 tablet by mouth at bedtime. (Patient not taking: Reported on 04/02/2024) 60 tablet 2    ondansetron  (ZOFRAN -ODT) 4 MG disintegrating tablet Take 1 tablet (4 mg total) by mouth every 6 (six) hours as needed for nausea. (Patient not taking: Reported on 04/02/2024) 20 tablet 0    promethazine  (PHENERGAN ) 12.5 MG tablet Take 1 tablet (12.5 mg total) by mouth every 6 (six) hours as needed for nausea or vomiting. (Patient not taking: Reported on 04/02/2024) 30 tablet 0    promethazine  (PHENERGAN ) 25 MG tablet Take 1 tablet (25 mg total) by mouth every 6 (six) hours as needed for nausea or vomiting. (Patient not taking: Reported on 04/02/2024) 30 tablet 1     ROS: Negative aside from what is listed in the HPI. Physical Exam   Blood pressure 131/79, pulse (!) 110, temperature 98.1 F (36.7 C), temperature source Oral, resp. rate 12, height 5' 8 (1.727 m), weight 93.8 kg, last menstrual period 09/18/2023.  Physical Exam Vitals and nursing note reviewed.  Constitutional:      Appearance: She is well-developed.  HENT:     Head: Normocephalic and atraumatic.     Mouth/Throat:  Mouth: Mucous membranes are moist.  Eyes:     Extraocular Movements: Extraocular movements intact.  Cardiovascular:     Rate and Rhythm: Normal rate and regular rhythm.  Pulmonary:     Effort: Pulmonary effort is normal.  Abdominal:     Palpations: Abdomen is soft.     Tenderness: There is no abdominal tenderness.  Skin:    Capillary Refill: Capillary refill takes less than 2 seconds.  Neurological:     General: No focal deficit present.     Mental Status: She is alert.     MAU Course    MDM low  Assessment and Plan   Patient is a 18 y/o G1P0 at [redacted]w[redacted]d who presents to MAU for decreased fetal movement as well as mild contractions.  -NST reactive -SVE: fingertip/thick/3, unchanged after 1 hour -FFN positive but likely false  positive due to recent sexual activity. -By discharge, contractions had subsided.  -Safe to discharge home -Discussed fetal kick counts -Detailed return precautions provided    Katrinka Herbison L Neira Bentsen 05/11/2024, 10:42 PM

## 2024-05-14 ENCOUNTER — Encounter: Payer: Self-pay | Admitting: Family Medicine

## 2024-05-14 ENCOUNTER — Encounter: Admitting: Family Medicine

## 2024-05-14 NOTE — Progress Notes (Signed)
Patient did not keep appointment today. She will be called to reschedule.  

## 2024-05-21 ENCOUNTER — Ambulatory Visit: Admitting: Obstetrics

## 2024-05-21 VITALS — BP 133/77 | HR 88 | Wt 206.5 lb

## 2024-05-21 DIAGNOSIS — F411 Generalized anxiety disorder: Secondary | ICD-10-CM | POA: Diagnosis not present

## 2024-05-21 DIAGNOSIS — Z3480 Encounter for supervision of other normal pregnancy, unspecified trimester: Secondary | ICD-10-CM

## 2024-05-21 DIAGNOSIS — O99013 Anemia complicating pregnancy, third trimester: Secondary | ICD-10-CM | POA: Diagnosis not present

## 2024-05-21 MED ORDER — ACCRUFER 30 MG PO CAPS: 1.0000 | ORAL_CAPSULE | Freq: Two times a day (BID) | ORAL | 3 refills | Status: AC

## 2024-05-21 NOTE — Progress Notes (Signed)
 Subjective:  Sherry Franklin is a 18 y.o. G1P0000 at [redacted]w[redacted]d being seen today for ongoing prenatal care.  She is currently monitored for the following issues for this low-risk pregnancy and has Substance induced mood disorder (HCC); GAD (generalized anxiety disorder); Cannabis use disorder, mild, abuse; Supervision of other normal pregnancy, antepartum; and Anemia affecting pregnancy in third trimester on their problem list.  Patient reports no complaints.  Contractions: Irritability. Vag. Bleeding: None.  Movement: Present. Denies leaking of fluid.   The following portions of the patient's history were reviewed and updated as appropriate: allergies, current medications, past family history, past medical history, past social history, past surgical history and problem list. Problem list updated.  Objective:   Vitals:   05/21/24 1347  BP: 133/77  Pulse: 88  Weight: 206 lb 8 oz (93.7 kg)    Fetal Status:     Movement: Present     General:  Alert, oriented and cooperative. Patient is in no acute distress.  Skin: Skin is warm and dry. No rash noted.   Cardiovascular: Normal heart rate noted  Respiratory: Normal respiratory effort, no problems with respiration noted  Abdomen: Soft, gravid, appropriate for gestational age. Pain/Pressure: Present     Pelvic:  Cervical exam deferred        Extremities: Normal range of motion.  Edema: Trace  Mental Status: Normal mood and affect. Normal behavior. Normal judgment and thought content.   Urinalysis:      Assessment and Plan:  Pregnancy: G1P0000 at [redacted]w[redacted]d  1. Supervision of other normal pregnancy, antepartum (Primary)  2. Anemia affecting pregnancy in third trimester - completed iron infusions - discontinue FeSO4 Rx: - Ferric Maltol  (ACCRUFER ) 30 MG CAPS; Take 1 capsule (30 mg total) by mouth 2 (two) times daily before a meal. Take 2 hrs before, or 2 hrs after a meal.  Dispense: 60 capsule; Refill: 3  3. GAD (generalized anxiety disorder) -  clinically stable  Preterm labor symptoms and general obstetric precautions including but not limited to vaginal bleeding, contractions, leaking of fluid and fetal movement were reviewed in detail with the patient. Please refer to After Visit Summary for other counseling recommendations.   Return in about 1 week (around 05/28/2024) for ROB.   Rudy Carlin LABOR, MD 05/21/2024

## 2024-05-21 NOTE — Progress Notes (Signed)
 Pt presents for ROB visit. Pt was seen in MAU 05-12-23. C/o still feeling weak and fatigue. Wants to know how to increase iron levels.

## 2024-05-22 ENCOUNTER — Ambulatory Visit: Admitting: Cardiology

## 2024-05-28 ENCOUNTER — Other Ambulatory Visit (HOSPITAL_COMMUNITY)
Admission: RE | Admit: 2024-05-28 | Discharge: 2024-05-28 | Disposition: A | Discharge status: Home / Self Care | Source: Ambulatory Visit | Attending: Obstetrics and Gynecology | Admitting: Obstetrics and Gynecology

## 2024-05-28 ENCOUNTER — Ambulatory Visit: Admitting: Obstetrics and Gynecology

## 2024-05-28 VITALS — BP 122/80 | HR 102 | Wt 201.0 lb

## 2024-05-28 DIAGNOSIS — Z3A36 36 weeks gestation of pregnancy: Secondary | ICD-10-CM | POA: Diagnosis not present

## 2024-05-28 DIAGNOSIS — Z3480 Encounter for supervision of other normal pregnancy, unspecified trimester: Secondary | ICD-10-CM | POA: Diagnosis present

## 2024-05-28 DIAGNOSIS — O99013 Anemia complicating pregnancy, third trimester: Secondary | ICD-10-CM

## 2024-05-28 NOTE — Progress Notes (Deleted)
[name] 

## 2024-05-28 NOTE — Progress Notes (Signed)
   PRENATAL VISIT NOTE  Subjective:  Sherry Franklin is a 18 y.o. G1P0000 at [redacted]w[redacted]d being seen today for ongoing prenatal care.  She is currently monitored for the following issues for this low-risk pregnancy and has Substance induced mood disorder (HCC); GAD (generalized anxiety disorder); Cannabis use disorder, mild, abuse; Supervision of other normal pregnancy, antepartum; and Anemia affecting pregnancy in third trimester on their problem list.  Patient doing well with no acute concerns today. She reports no complaints.  Contractions: Irritability. Vag. Bleeding: None.  Movement: Present. Denies leaking of fluid.   The following portions of the patient's history were reviewed and updated as appropriate: allergies, current medications, past family history, past medical history, past social history, past surgical history and problem list. Problem list updated.  Objective:   Vitals:   05/28/24 1438  BP: 122/80  Pulse: (!) 102  Weight: 201 lb (91.2 kg)    Fetal Status: Fetal Heart Rate (bpm): 154 (Simultaneous filing. User may not have seen previous data.) Fundal Height: 36 cm Movement: Present     General:  Alert, oriented and cooperative. Patient is in no acute distress.  Skin: Skin is warm and dry. No rash noted.   Cardiovascular: Normal heart rate noted  Respiratory: Normal respiratory effort, no problems with respiration noted  Abdomen: Soft, gravid, appropriate for gestational age.  Pain/Pressure: Present     Pelvic: Cervical exam performed Dilation: 1.5 Effacement (%): 60 Station: -2  Extremities: Normal range of motion.  Edema: Trace  Mental Status:  Normal mood and affect. Normal behavior. Normal judgment and thought content.   Assessment and Plan:  Pregnancy: G1P0000 at [redacted]w[redacted]d  1. Supervision of other normal pregnancy, antepartum (Primary) Continue routine prenatal care Labor precautions given  - Culture, beta strep (group b only) - Cervicovaginal ancillary only( [facility])  2. [redacted] weeks gestation of pregnancy  - Culture, beta strep (group b only) - Cervicovaginal ancillary only( [facility])  3. Anemia affecting pregnancy in third trimester Continue oral iron  Preterm labor symptoms and general obstetric precautions including but not limited to vaginal bleeding, contractions, leaking of fluid and fetal movement were reviewed in detail with the patient.  Please refer to After Visit Summary for other counseling recommendations.   Return in about 1 week (around 06/04/2024) for ROB, in person.   Jerilynn Buddle, MD Faculty Attending Center for [facility]

## 2024-05-28 NOTE — Progress Notes (Signed)
 ROB/GBS.  Pt wants her cervix checked.

## 2024-05-29 LAB — CERVICOVAGINAL ANCILLARY ONLY
Chlamydia: NEGATIVE
Comment: NEGATIVE
Comment: NORMAL
Neisseria Gonorrhea: NEGATIVE

## 2024-05-31 LAB — CULTURE, BETA STREP (GROUP B ONLY): Strep Gp B Culture: POSITIVE — AB

## 2024-06-01 ENCOUNTER — Ambulatory Visit: Payer: Self-pay | Admitting: Obstetrics and Gynecology

## 2024-06-01 DIAGNOSIS — Z3480 Encounter for supervision of other normal pregnancy, unspecified trimester: Secondary | ICD-10-CM

## 2024-06-04 ENCOUNTER — Ambulatory Visit (INDEPENDENT_AMBULATORY_CARE_PROVIDER_SITE_OTHER): Admitting: Obstetrics and Gynecology

## 2024-06-04 ENCOUNTER — Other Ambulatory Visit: Payer: Self-pay

## 2024-06-04 VITALS — BP 138/67 | HR 90 | Temp 98.1°F | Wt 215.0 lb

## 2024-06-04 DIAGNOSIS — Z3A37 37 weeks gestation of pregnancy: Secondary | ICD-10-CM | POA: Diagnosis not present

## 2024-06-04 DIAGNOSIS — O99013 Anemia complicating pregnancy, third trimester: Secondary | ICD-10-CM | POA: Diagnosis not present

## 2024-06-04 DIAGNOSIS — Z3480 Encounter for supervision of other normal pregnancy, unspecified trimester: Secondary | ICD-10-CM

## 2024-06-04 MED ORDER — BLOOD PRESSURE KIT DEVI: 1.0000 | 0 refills | Status: AC

## 2024-06-04 NOTE — Progress Notes (Signed)
   PRENATAL VISIT NOTE  Subjective:  Sherry Franklin is a 18 y.o. G1P0000 at [redacted]w[redacted]d being seen today for ongoing prenatal care.  She is currently monitored for the following issues for this low-risk pregnancy and has Substance induced mood disorder (HCC); GAD (generalized anxiety disorder); Cannabis use disorder, mild, abuse; Supervision of other normal pregnancy, antepartum; Anemia affecting pregnancy in third trimester; and Mother positive for group B Streptococcus colonization on their problem list.  Patient reports swelling in her legs.  Contractions: Irritability. Vag. Bleeding: None.  Movement: Present. Denies leaking of fluid.   The following portions of the patient's history were reviewed and updated as appropriate: allergies, current medications, past family history, past medical history, past social history, past surgical history and problem list.   Objective:   Vitals:   06/04/24 1342 06/04/24 1357  BP: 138/67   Pulse: (!) 128 90  Temp: (!) 96.8 F (36 C) 98.1 F (36.7 C)  Weight: 215 lb (97.5 kg)   Patient eating ice during first vitals check Body mass index is 32.69 kg/m. Total weight gain: 35 lb (15.9 kg)   Fetal Status: Fetal Heart Rate (bpm): 145 Fundal Height: 37 cm Movement: Present  Presentation: Vertex  General:  Alert, oriented and cooperative. Patient is in no acute distress.  Skin: Skin is warm and dry. No rash noted.   Cardiovascular: Normal heart rate noted  Respiratory: Normal respiratory effort, no problems with respiration noted  Abdomen: Soft, gravid, appropriate for gestational age.  Pain/Pressure: Present     Pelvic: Cervical exam performed in the presence of a chaperone Dilation: 1 Effacement (%): 70 Station: -3  Extremities: Normal range of motion.  Edema: Trace  Mental Status: Normal mood and affect. Normal behavior. Normal judgment and thought content.   Assessment and Plan:  Pregnancy: G1P0000 at [redacted]w[redacted]d 1. Supervision of other normal pregnancy,  antepartum (Primary) Anticipatory guidance Rec home BP monitoring given new swelling and borderline but normal BP. Preeclampsia precautions reviewed  2. Anemia affecting pregnancy in third trimester Lab Results  Component Value Date   HGB 11.8 (L) 05/07/2024  Continue iron  3. Mother positive for group B Streptococcus colonization Reviewed ppx in labor  Term labor symptoms and general obstetric precautions including but not limited to vaginal bleeding, contractions, leaking of fluid and fetal movement were reviewed in detail with the patient. Please refer to After Visit Summary for other counseling recommendations.   Return in about 1 week (around 06/11/2024).  Future Appointments  Date Time Provider Department Center  06/10/2024  2:10 PM Rudy Carlin LABOR, MD [facility] None    Rollo Sherry Bring, MD

## 2024-06-04 NOTE — Progress Notes (Signed)
 ROB, wants cervix check.

## 2024-06-10 ENCOUNTER — Ambulatory Visit (INDEPENDENT_AMBULATORY_CARE_PROVIDER_SITE_OTHER): Admitting: Obstetrics

## 2024-06-10 ENCOUNTER — Encounter: Payer: Self-pay | Admitting: Obstetrics

## 2024-06-10 VITALS — BP 126/79 | HR 81 | Wt 209.9 lb

## 2024-06-10 DIAGNOSIS — O99013 Anemia complicating pregnancy, third trimester: Secondary | ICD-10-CM

## 2024-06-10 DIAGNOSIS — Z3A38 38 weeks gestation of pregnancy: Secondary | ICD-10-CM | POA: Diagnosis not present

## 2024-06-10 DIAGNOSIS — Z3480 Encounter for supervision of other normal pregnancy, unspecified trimester: Secondary | ICD-10-CM

## 2024-06-10 NOTE — Progress Notes (Signed)
 Subjective:  Sherry Franklin is a 18 y.o. G1P0000 at [redacted]w[redacted]d being seen today for ongoing prenatal care.  She is currently monitored for the following issues for this low-risk pregnancy and has Substance induced mood disorder (HCC); GAD (generalized anxiety disorder); Cannabis use disorder, mild, abuse; Supervision of other normal pregnancy, antepartum; Anemia affecting pregnancy in third trimester; and Mother positive for group B Streptococcus colonization on their problem list.  Patient reports no complaints.  Contractions: Irritability. Vag. Bleeding: None.  Movement: Present. Denies leaking of fluid.   The following portions of the patient's history were reviewed and updated as appropriate: allergies, current medications, past family history, past medical history, past social history, past surgical history and problem list. Problem list updated.  Objective:   Vitals:   06/10/24 1418  BP: 126/79  Pulse: 81  Weight: 209 lb 14.4 oz (95.2 kg)    Fetal Status: Fetal Heart Rate (bpm): 135   Movement: Present     General:  Alert, oriented and cooperative. Patient is in no acute distress.  Skin: Skin is warm and dry. No rash noted.   Cardiovascular: Normal heart rate noted  Respiratory: Normal respiratory effort, no problems with respiration noted  Abdomen: Soft, gravid, appropriate for gestational age. Pain/Pressure: Present     Pelvic:  Cervical exam deferred        Extremities: Normal range of motion.  Edema: Trace  Mental Status: Normal mood and affect. Normal behavior. Normal judgment and thought content.   Urinalysis:      Assessment and Plan:  Pregnancy: G1P0000 at [redacted]w[redacted]d  1. Supervision of other normal pregnancy, antepartum (Primary)  2. Mother positive for group B Streptococcus colonization  3. Anemia affecting pregnancy in third trimester    There are no diagnoses linked to this encounter. Term labor symptoms and general obstetric precautions including but not limited to  vaginal bleeding, contractions, leaking of fluid and fetal movement were reviewed in detail with the patient. Please refer to After Visit Summary for other counseling recommendations.   Return in about 1 week (around 06/17/2024) for ROB.   Rudy Carlin LABOR, MD 06/10/2024

## 2024-06-10 NOTE — Progress Notes (Signed)
 Pt 38 weeks today. Asking for cervix check and membrane sweep.

## 2024-06-17 ENCOUNTER — Ambulatory Visit: Admitting: Obstetrics and Gynecology

## 2024-06-17 VITALS — BP 132/81 | HR 83 | Wt 210.8 lb

## 2024-06-17 DIAGNOSIS — Z3A39 39 weeks gestation of pregnancy: Secondary | ICD-10-CM

## 2024-06-17 DIAGNOSIS — Z3480 Encounter for supervision of other normal pregnancy, unspecified trimester: Secondary | ICD-10-CM

## 2024-06-17 NOTE — Progress Notes (Signed)
 Pt presents for ROB visit. Pt c/o contractions since 4 am. Has not been timing them. Wants cervical check

## 2024-06-17 NOTE — Progress Notes (Signed)
   PRENATAL VISIT NOTE  Subjective:  Sherry Franklin is a 18 y.o. G1P0000 at [redacted]w[redacted]d being seen today for ongoing prenatal care.  She is currently monitored for the following issues for this low-risk pregnancy and has Substance induced mood disorder (HCC); GAD (generalized anxiety disorder); Cannabis use disorder, mild, abuse; Supervision of other normal pregnancy, antepartum; Anemia affecting pregnancy in third trimester; and Mother positive for group B Streptococcus colonization on their problem list.  Patient reports intermittent contractions since 4am this morning. .  Contractions: Irregular. Vag. Bleeding: None.  Movement: Present. Denies leaking of fluid.   The following portions of the patient's history were reviewed and updated as appropriate: allergies, current medications, past family history, past medical history, past social history, past surgical history and problem list.   Objective:    Vitals:   06/17/24 1046  BP: 132/81  Pulse: 83  Weight: 210 lb 12.8 oz (95.6 kg)    Fetal Status:  Fetal Heart Rate (bpm): 134 Fundal Height: 39 cm Movement: Present Presentation: Vertex  General: Alert, oriented and cooperative. Patient is in no acute distress.  Skin: Skin is warm and dry. No rash noted.   Cardiovascular: Normal heart rate noted  Respiratory: Normal respiratory effort, no problems with respiration noted  Abdomen: Soft, gravid, appropriate for gestational age.  Pain/Pressure: Present     Pelvic: Cervical exam performed in the presence of a chaperone Dilation: 2.5 Effacement (%): 70 Station: -3  Extremities: Normal range of motion.  Edema: None  Mental Status: Normal mood and affect. Normal behavior. Normal judgment and thought content.   Assessment and Plan:  Pregnancy: G1P0000 at [redacted]w[redacted]d 1. Supervision of other normal pregnancy, antepartum (Primary) BP and FHR normal Doing well, feeling regular movement    2. [redacted] weeks gestation of pregnancy Labor precautions discussed    3. Mother positive for group B Streptococcus colonization Tx in labor   Term labor symptoms and general obstetric precautions including but not limited to vaginal bleeding, contractions, leaking of fluid and fetal movement were reviewed in detail with the patient. Please refer to After Visit Summary for other counseling recommendations.   Return in about 1 week (around 06/24/2024) for OB VISIT (MD or APP).    Nidia Daring, FNP

## 2024-06-24 ENCOUNTER — Ambulatory Visit: Admitting: Obstetrics & Gynecology

## 2024-06-24 VITALS — BP 136/81 | HR 77 | Wt 214.0 lb

## 2024-06-24 DIAGNOSIS — Z3A40 40 weeks gestation of pregnancy: Secondary | ICD-10-CM | POA: Diagnosis not present

## 2024-06-24 DIAGNOSIS — O99013 Anemia complicating pregnancy, third trimester: Secondary | ICD-10-CM

## 2024-06-24 DIAGNOSIS — D649 Anemia, unspecified: Secondary | ICD-10-CM | POA: Diagnosis not present

## 2024-06-24 DIAGNOSIS — Z3480 Encounter for supervision of other normal pregnancy, unspecified trimester: Secondary | ICD-10-CM

## 2024-06-24 NOTE — Progress Notes (Signed)
   PRENATAL VISIT NOTE  Subjective:  Sherry Franklin is a 18 y.o. G1P0000 at [redacted]w[redacted]d being seen today for ongoing prenatal care.  She is currently monitored for the following issues for this high-risk pregnancy and has Substance induced mood disorder (HCC); GAD (generalized anxiety disorder); Cannabis use disorder, mild, abuse; Supervision of other normal pregnancy, antepartum; Anemia affecting pregnancy in third trimester; and Mother positive for group B Streptococcus colonization on their problem list.  Patient reports occasional contractions.  Contractions: Irregular. Vag. Bleeding: None.  Movement: Present. Denies leaking of fluid.   The following portions of the patient's history were reviewed and updated as appropriate: allergies, current medications, past family history, past medical history, past social history, past surgical history and problem list.   Objective:    Vitals:   06/24/24 1512 06/24/24 1518  BP: (!) 147/77 136/81  Pulse: 71 77  Weight: 214 lb (97.1 kg)     Fetal Status:  Fetal Heart Rate (bpm): 130   Movement: Present Presentation: Vertex  General: Alert, oriented and cooperative. Patient is in no acute distress.  Skin: Skin is warm and dry. No rash noted.   Cardiovascular: Normal heart rate noted  Respiratory: Normal respiratory effort, no problems with respiration noted  Abdomen: Soft, gravid, appropriate for gestational age.  Pain/Pressure: Present     Pelvic: Cervical exam performed in the presence of a chaperone Dilation: 2.5 Effacement (%): 80    Extremities: Normal range of motion.     Mental Status: Normal mood and affect. Normal behavior. Normal judgment and thought content.   Assessment and Plan:  Pregnancy: G1P0000 at [redacted]w[redacted]d 1. Supervision of other normal pregnancy, antepartum (Primary) BP noted elevated x1 at 40 weeks IOL 1016 2. Mother positive for group B Streptococcus colonization   3. Anemia affecting pregnancy in third trimester   Term labor  symptoms and general obstetric precautions including but not limited to vaginal bleeding, contractions, leaking of fluid and fetal movement were reviewed in detail with the patient. Please refer to After Visit Summary for other counseling recommendations.   Return in about 5 days (around 06/29/2024).  Future Appointments  Date Time Provider Department Center  06/25/2024  6:45 AM [facility] SCHED ROOM [facility] None    Lynwood Solomons, MD

## 2024-06-24 NOTE — Progress Notes (Signed)
 Pt would like cervix check today.  Pt also needs IOL scheduled.

## 2024-06-25 ENCOUNTER — Inpatient Hospital Stay (HOSPITAL_COMMUNITY)
Admission: AD | Admit: 2024-06-25 | Discharge: 2024-06-27 | DRG: 807 | Disposition: A | Attending: Obstetrics and Gynecology | Admitting: Obstetrics and Gynecology

## 2024-06-25 ENCOUNTER — Inpatient Hospital Stay (HOSPITAL_COMMUNITY)

## 2024-06-25 ENCOUNTER — Inpatient Hospital Stay (HOSPITAL_COMMUNITY): Admitting: Anesthesiology

## 2024-06-25 ENCOUNTER — Encounter (HOSPITAL_COMMUNITY): Payer: Self-pay | Admitting: Obstetrics and Gynecology

## 2024-06-25 DIAGNOSIS — F411 Generalized anxiety disorder: Secondary | ICD-10-CM | POA: Diagnosis present

## 2024-06-25 DIAGNOSIS — O99344 Other mental disorders complicating childbirth: Secondary | ICD-10-CM | POA: Diagnosis present

## 2024-06-25 DIAGNOSIS — Z3A40 40 weeks gestation of pregnancy: Secondary | ICD-10-CM

## 2024-06-25 DIAGNOSIS — O139 Gestational [pregnancy-induced] hypertension without significant proteinuria, unspecified trimester: Secondary | ICD-10-CM | POA: Insufficient documentation

## 2024-06-25 DIAGNOSIS — Z30430 Encounter for insertion of intrauterine contraceptive device: Secondary | ICD-10-CM

## 2024-06-25 DIAGNOSIS — O134 Gestational [pregnancy-induced] hypertension without significant proteinuria, complicating childbirth: Secondary | ICD-10-CM | POA: Diagnosis present

## 2024-06-25 DIAGNOSIS — L732 Hidradenitis suppurativa: Secondary | ICD-10-CM | POA: Diagnosis present

## 2024-06-25 DIAGNOSIS — O9902 Anemia complicating childbirth: Secondary | ICD-10-CM | POA: Diagnosis present

## 2024-06-25 DIAGNOSIS — O99824 Streptococcus B carrier state complicating childbirth: Secondary | ICD-10-CM | POA: Diagnosis present

## 2024-06-25 DIAGNOSIS — O99013 Anemia complicating pregnancy, third trimester: Secondary | ICD-10-CM

## 2024-06-25 DIAGNOSIS — Z3490 Encounter for supervision of normal pregnancy, unspecified, unspecified trimester: Secondary | ICD-10-CM

## 2024-06-25 DIAGNOSIS — O165 Unspecified maternal hypertension, complicating the puerperium: Secondary | ICD-10-CM | POA: Diagnosis present

## 2024-06-25 DIAGNOSIS — O480 Post-term pregnancy: Secondary | ICD-10-CM

## 2024-06-25 DIAGNOSIS — O9972 Diseases of the skin and subcutaneous tissue complicating childbirth: Secondary | ICD-10-CM | POA: Diagnosis present

## 2024-06-25 DIAGNOSIS — R030 Elevated blood-pressure reading, without diagnosis of hypertension: Secondary | ICD-10-CM | POA: Diagnosis present

## 2024-06-25 DIAGNOSIS — Z3480 Encounter for supervision of other normal pregnancy, unspecified trimester: Principal | ICD-10-CM

## 2024-06-25 DIAGNOSIS — O99820 Streptococcus B carrier state complicating pregnancy: Secondary | ICD-10-CM

## 2024-06-25 LAB — COMPREHENSIVE METABOLIC PANEL WITH GFR
ALT: 9 U/L (ref 0–44)
AST: 15 U/L (ref 15–41)
Albumin: 2.3 g/dL — ABNORMAL LOW (ref 3.5–5.0)
Alkaline Phosphatase: 177 U/L — ABNORMAL HIGH (ref 38–126)
Anion gap: 10 (ref 5–15)
BUN: <5 mg/dL — ABNORMAL LOW (ref 6–20)
CO2: 20 mmol/L — ABNORMAL LOW (ref 22–32)
Calcium: 8.4 mg/dL — ABNORMAL LOW (ref 8.9–10.3)
Chloride: 107 mmol/L (ref 98–111)
Creatinine, Ser: 0.61 mg/dL (ref 0.44–1.00)
GFR, Estimated: >60 mL/min (ref >60)
Glucose, Bld: 82 mg/dL (ref 70–99)
Potassium: 3.5 mmol/L (ref 3.5–5.1)
Sodium: 137 mmol/L (ref 135–145)
Total Bilirubin: 0.7 mg/dL (ref 0.0–1.2)
Total Protein: 6.3 g/dL — ABNORMAL LOW (ref 6.5–8.1)

## 2024-06-25 LAB — CBC
HCT: 37.9 % (ref 36.0–46.0)
HCT: 32.2 % — ABNORMAL LOW (ref 36.0–46.0)
Hemoglobin: 12.1 g/dL (ref 12.0–15.0)
Hemoglobin: 10.3 g/dL — ABNORMAL LOW (ref 12.0–15.0)
MCH: 27 pg (ref 26.0–34.0)
MCH: 27.2 pg (ref 26.0–34.0)
MCHC: 31.9 g/dL (ref 30.0–36.0)
MCHC: 32 g/dL (ref 30.0–36.0)
MCV: 84.6 fL (ref 80.0–100.0)
MCV: 85 fL (ref 80.0–100.0)
Platelets: 248 K/uL (ref 150–400)
Platelets: 202 K/uL (ref 150–400)
RBC: 4.48 MIL/uL (ref 3.87–5.11)
RBC: 3.79 MIL/uL — ABNORMAL LOW (ref 3.87–5.11)
RDW: 15.3 % (ref 11.5–15.5)
RDW: 15.1 % (ref 11.5–15.5)
WBC: 7.2 K/uL (ref 4.0–10.5)
WBC: 9.5 K/uL (ref 4.0–10.5)
nRBC: 0 % (ref 0.0–0.2)
nRBC: 0 % (ref 0.0–0.2)

## 2024-06-25 LAB — TYPE AND SCREEN
ABO/RH(D): B POS
Antibody Screen: NEGATIVE

## 2024-06-25 LAB — RPR: RPR Ser Ql: NONREACTIVE

## 2024-06-25 MED ORDER — OXYTOCIN-SODIUM CHLORIDE 30-0.9 UT/500ML-% IV SOLN: 2.5000 [IU]/h | INTRAVENOUS | Status: DC

## 2024-06-25 MED ORDER — PHENYLEPHRINE 80 MCG/ML (10ML) SYRINGE FOR IV PUSH (FOR BLOOD PRESSURE SUPPORT): 80.0000 ug | PREFILLED_SYRINGE | INTRAVENOUS | Status: DC | PRN

## 2024-06-25 MED ORDER — ACETAMINOPHEN 325 MG PO TABS: 650.0000 mg | ORAL_TABLET | ORAL | Status: DC | PRN

## 2024-06-25 MED ORDER — LEVONORGESTREL 20 MCG/DAY IU IUD
1.0000 | INTRAUTERINE_SYSTEM | Freq: Once | INTRAUTERINE | Status: AC
  Administered 2024-06-25: 1 via INTRAUTERINE

## 2024-06-25 MED ORDER — DIPHENHYDRAMINE HCL 50 MG/ML IJ SOLN: 12.5000 mg | INTRAMUSCULAR | Status: DC | PRN

## 2024-06-25 MED ORDER — ZOLPIDEM TARTRATE 5 MG PO TABS: 5.0000 mg | ORAL_TABLET | Freq: Every evening | ORAL | Status: DC | PRN

## 2024-06-25 MED ORDER — LACTATED RINGERS IV SOLN: INTRAVENOUS | Status: DC

## 2024-06-25 MED ORDER — IBUPROFEN 600 MG PO TABS
600.0000 mg | ORAL_TABLET | Freq: Four times a day (QID) | ORAL | Status: DC
  Administered 2024-06-25 – 2024-06-27 (×4): 600 mg via ORAL
  Filled 2024-06-25 (×6): qty 1

## 2024-06-25 MED ORDER — CLINDAMYCIN PHOS (TWICE-DAILY) 1 % EX GEL
Freq: Two times a day (BID) | CUTANEOUS | Status: DC
  Administered 2024-06-25: 1 via TOPICAL
  Filled 2024-06-25: qty 30

## 2024-06-25 MED ORDER — OXYTOCIN BOLUS FROM INFUSION
333.0000 mL | Freq: Once | INTRAVENOUS | Status: AC
  Administered 2024-06-25: 333 mL via INTRAVENOUS

## 2024-06-25 MED ORDER — WITCH HAZEL-GLYCERIN EX PADS
1.0000 | MEDICATED_PAD | CUTANEOUS | Status: DC | PRN
  Administered 2024-06-25: 1 via TOPICAL

## 2024-06-25 MED ORDER — BENZOCAINE-MENTHOL 20-0.5 % EX AERO
1.0000 | INHALATION_SPRAY | CUTANEOUS | Status: DC | PRN
  Administered 2024-06-25: 1 via TOPICAL
  Filled 2024-06-25: qty 56

## 2024-06-25 MED ORDER — SIMETHICONE 80 MG PO CHEW: 80.0000 mg | CHEWABLE_TABLET | ORAL | Status: DC | PRN

## 2024-06-25 MED ORDER — IRON SUCROSE 200 MG IVPB - SIMPLE MED: 200.0000 mg | Status: DC

## 2024-06-25 MED ORDER — FUROSEMIDE 20 MG PO TABS
10.0000 mg | ORAL_TABLET | Freq: Every day | ORAL | Status: DC
  Administered 2024-06-25 – 2024-06-26 (×2): 10 mg via ORAL
  Filled 2024-06-25: qty 1
  Filled 2024-06-25: qty 0.5
  Filled 2024-06-25: qty 1

## 2024-06-25 MED ORDER — PRENATAL MULTIVITAMIN CH
1.0000 | ORAL_TABLET | Freq: Every day | ORAL | Status: DC
  Administered 2024-06-26: 1 via ORAL
  Filled 2024-06-25: qty 1

## 2024-06-25 MED ORDER — LIDOCAINE HCL (PF) 1 % IJ SOLN
INTRAMUSCULAR | Status: DC | PRN
Start: 2024-06-25 — End: 2024-06-25
  Administered 2024-06-25: 8 mL via EPIDURAL

## 2024-06-25 MED ORDER — LIDOCAINE HCL (PF) 1 % IJ SOLN: 30.0000 mL | INTRAMUSCULAR | Status: DC | PRN

## 2024-06-25 MED ORDER — TERBUTALINE SULFATE 1 MG/ML IJ SOLN: 0.2500 mg | Freq: Once | INTRAMUSCULAR | Status: DC | PRN

## 2024-06-25 MED ORDER — LACTATED RINGERS IV SOLN: 500.0000 mL | Freq: Once | INTRAVENOUS | Status: DC

## 2024-06-25 MED ORDER — SODIUM CHLORIDE 0.9 % IV SOLN
5.0000 10*6.[IU] | Freq: Once | INTRAVENOUS | Status: AC
  Administered 2024-06-25: 5 10*6.[IU] via INTRAVENOUS
  Filled 2024-06-25: qty 5

## 2024-06-25 MED ORDER — FENTANYL-BUPIVACAINE-NACL 0.5-0.125-0.9 MG/250ML-% EP SOLN: 12.0000 mL/h | EPIDURAL | Status: DC | PRN

## 2024-06-25 MED ORDER — TETANUS-DIPHTH-ACELL PERTUSSIS 5-2-15.5 LF-MCG/0.5 IM SUSP: 0.5000 mL | Freq: Once | INTRAMUSCULAR | Status: DC

## 2024-06-25 MED ORDER — OXYCODONE-ACETAMINOPHEN 5-325 MG PO TABS: 2.0000 | ORAL_TABLET | ORAL | Status: DC | PRN

## 2024-06-25 MED ORDER — LACTATED RINGERS IV SOLN
500.0000 mL | Freq: Once | INTRAVENOUS | Status: AC
  Administered 2024-06-25: 500 mL via INTRAVENOUS

## 2024-06-25 MED ORDER — OXYCODONE-ACETAMINOPHEN 5-325 MG PO TABS: 1.0000 | ORAL_TABLET | ORAL | Status: DC | PRN

## 2024-06-25 MED ORDER — DIBUCAINE (PERIANAL) 1 % EX OINT: 1.0000 | TOPICAL_OINTMENT | CUTANEOUS | Status: DC | PRN

## 2024-06-25 MED ORDER — ONDANSETRON HCL 4 MG/2ML IJ SOLN: 4.0000 mg | INTRAMUSCULAR | Status: DC | PRN

## 2024-06-25 MED ORDER — EPHEDRINE 5 MG/ML INJ: 10.0000 mg | INTRAVENOUS | Status: DC | PRN

## 2024-06-25 MED ORDER — COCONUT OIL OIL: 1.0000 | TOPICAL_OIL | Status: DC | PRN

## 2024-06-25 MED ORDER — SOD CITRATE-CITRIC ACID 500-334 MG/5ML PO SOLN: 30.0000 mL | ORAL | Status: DC | PRN

## 2024-06-25 MED ORDER — DIPHENHYDRAMINE HCL 25 MG PO CAPS: 25.0000 mg | ORAL_CAPSULE | Freq: Four times a day (QID) | ORAL | Status: DC | PRN

## 2024-06-25 MED ORDER — OXYCODONE HCL 5 MG PO TABS: 10.0000 mg | ORAL_TABLET | ORAL | Status: DC | PRN

## 2024-06-25 MED ORDER — CLINDAMYCIN PHOSPHATE 1 % EX SOLN
Freq: Two times a day (BID) | CUTANEOUS | Status: DC
  Filled 2024-06-25: qty 30

## 2024-06-25 MED ORDER — LACTATED RINGERS IV SOLN: 500.0000 mL | INTRAVENOUS | Status: DC | PRN

## 2024-06-25 MED ORDER — OXYCODONE HCL 5 MG PO TABS: 5.0000 mg | ORAL_TABLET | ORAL | Status: DC | PRN

## 2024-06-25 MED ORDER — ONDANSETRON HCL 4 MG/2ML IJ SOLN
4.0000 mg | Freq: Four times a day (QID) | INTRAMUSCULAR | Status: DC | PRN
  Administered 2024-06-25: 4 mg via INTRAVENOUS
  Filled 2024-06-25: qty 2

## 2024-06-25 MED ORDER — ONDANSETRON HCL 4 MG PO TABS: 4.0000 mg | ORAL_TABLET | ORAL | Status: DC | PRN

## 2024-06-25 MED ORDER — OXYTOCIN-SODIUM CHLORIDE 30-0.9 UT/500ML-% IV SOLN
1.0000 m[IU]/min | INTRAVENOUS | Status: DC
  Administered 2024-06-25: 2 m[IU]/min via INTRAVENOUS
  Filled 2024-06-25: qty 500

## 2024-06-25 MED ORDER — SENNOSIDES-DOCUSATE SODIUM 8.6-50 MG PO TABS
2.0000 | ORAL_TABLET | Freq: Every day | ORAL | Status: DC
  Administered 2024-06-26 – 2024-06-27 (×2): 2 via ORAL
  Filled 2024-06-25 (×2): qty 2

## 2024-06-25 MED ORDER — FENTANYL-BUPIVACAINE-NACL 0.5-0.125-0.9 MG/250ML-% EP SOLN
12.0000 mL/h | EPIDURAL | Status: DC | PRN
  Administered 2024-06-25: 12 mL/h via EPIDURAL
  Filled 2024-06-25: qty 250

## 2024-06-25 MED ORDER — PENICILLIN G POT IN DEXTROSE 60000 UNIT/ML IV SOLN
3.0000 10*6.[IU] | INTRAVENOUS | Status: DC
  Administered 2024-06-25: 3 10*6.[IU] via INTRAVENOUS
  Filled 2024-06-25: qty 50

## 2024-06-25 NOTE — Anesthesia Procedure Notes (Signed)
 Epidural Patient location during procedure: OB Start time: 06/25/2024 2:22 PM End time: 06/25/2024 2:29 PM  Staffing Anesthesiologist: Mallory Manus, MD  Preanesthetic Checklist Completed: patient identified, IV checked, site marked, risks and benefits discussed, surgical consent, monitors and equipment checked, pre-op evaluation and timeout performed  Epidural Patient position: sitting Prep: DuraPrep and site prepped and draped Patient monitoring: continuous pulse ox and blood pressure Approach: midline Location: L4-L5 Injection technique: LOR air  Needle:  Needle type: Tuohy  Needle gauge: 17 G Needle length: 9 cm and 9 Needle insertion depth: 7 cm Catheter type: closed end flexible Catheter size: 19 Gauge Catheter at skin depth: 13 cm Test dose: negative  Assessment Events: blood not aspirated, no cerebrospinal fluid, injection not painful, no injection resistance, no paresthesia and negative IV test

## 2024-06-25 NOTE — Plan of Care (Signed)

## 2024-06-25 NOTE — H&P (Addendum)
 OBSTETRIC ADMISSION HISTORY AND PHYSICAL  Sherry Franklin is a 18 y.o. female G1P0000 with IUP at [redacted]w[redacted]d by LMP presenting for IOL secondary to elevated BP reading without diagnosis of gHTN or PreE (single elevated reading). She reports +FMs, No LOF, no VB, no blurry vision, headaches or peripheral edema, and RUQ pain.  She plans on bottle feeding. She is undecided for birth control. She received her prenatal care at [facility].    Dating: By LMP c/w 7 week US  --->  Estimated Date of Delivery: 06/24/24  Sono:  @[redacted]w[redacted]d , CWD, normal anatomy, cephalic presentation, anterior lie, 659g, 45% EFW  Prenatal History/Complications: LOW RISK PREGNANCY - Hx of Substance induced mood disorder - GAD - Hx of Cannabis use disorder - Antepartum anemia - GBS+  NURSING  PROVIDER  Office Location [facility] Dating by LMP C/W US  at [redacted]w[redacted]d  [facility] Model Traditional Anatomy U/S    Initiated care at  [facility]  English               LAB RESULTS   Support Person  FOB Genetics NIPS: LR female AFP: Negative      NT/IT (FT only)        Carrier Screen Horizon: Neg x 4  Rhogam  --/--/B POS (08/28 1058) A1C/GTT Early HgbA1C:  Third trimester 2 hr GTT: 71/111/97  Flu Vaccine No      TDaP Vaccine  04/16/24 Blood Type --/--/B POS (08/28 1058)  RSV Vaccine   Antibody NEG (08/28 1058)  COVID Vaccine No Rubella 6.00 (03/31 1112)  Feeding Plan breast RPR NON REACTIVE (08/28 1040)  Contraception pills HBsAg Negative (03/31 1112)  Circumcision Yes if female HIV Non Reactive (07/24 0821)  Pediatrician  [facility] HCVAb Non Reactive (03/31 1112)  Prenatal Classes        BTL Consent   Pap No results found for: DIAGPAP  BTL Pre-payment   GC/CT Initial:   36wks:    VBAC Consent   GBS Positive/-- (09/18 1501)positive For PCN allergy, check sensitivities   BRx Optimized? [X]  yes   [ ]  no      DME Rx [ ]  BP cuff [ ]  Weight Scale Waterbirth  [ ]  Class [ ]  Consent [ ]  CNM visit  PHQ9 & GAD7 [X]  new OB [  ] 28 weeks   [X]  36 weeks Induction  [ ]  Orders Entered [ ] Foley Y/N    Past Medical History: Past Medical History:  Diagnosis Date   Anxiety    Hidradenitis suppurativa     Past Surgical History: Past Surgical History:  Procedure Laterality Date   NO PAST SURGERIES      Obstetrical History: OB History     Gravida  1   Para  0   Term  0   Preterm  0   AB  0   Living  0      SAB  0   IAB  0   Ectopic  0   Multiple  0   Live Births  0           Social History Social History   Socioeconomic History   Marital status: Single    Spouse name: Not on file   Number of children: Not on file   Years of education: Not on file   Highest education level: Not on file  Occupational History   Not on file  Tobacco Use   Smoking status: Never   Smokeless tobacco: Never  Vaping Use   Vaping status: Never Used  Substance and Sexual Activity   Alcohol use: Not Currently   Drug use: Not Currently    Types: Marijuana    Comment: last  use feb 2025   Sexual activity: Yes    Birth control/protection: None  Other Topics Concern   Not on file  Social History Narrative   Not on file   Social Drivers of Health   Financial Resource Strain: Not on File (08/07/2022)   Received from [facility]    Financial Resource Strain: 0  Food Insecurity: No Food Insecurity (05/07/2024)   Hunger Vital Sign    Worried About Running Out of Food in the Last Year: Never true    Ran Out of Food in the Last Year: Never true  Transportation Needs: No Transportation Needs (05/07/2024)   PRAPARE - Administrator, Civil Service (Medical): No    Lack of Transportation (Non-Medical): No  Physical Activity: Not on File (08/07/2022)   Received from [facility]   Physical Activity    Physical Activity: 0  Recent Concern: Physical Activity - At Risk (08/07/2022)   Received from [facility]   Physical Activity    Physical Activity: 2  Stress: Not on File (12/28/2021)    Received from [facility]   Stress    Stress: 0  Social Connections: Not on File (08/07/2022)   Received from [facility]   Social Connections    Connectedness: 0    Family History: Family History  Problem Relation Age of Onset   Cancer Maternal Grandmother    Cancer Other    Asthma Neg Hx    Diabetes Neg Hx    Heart disease Neg Hx    Hypertension Neg Hx     Allergies: No Known Allergies  Medications Prior to Admission  Medication Sig Dispense Refill Last Dose/Taking   Blood Pressure Monitoring (BLOOD PRESSURE KIT) DEVI 1 Device by Does not apply route once a week. Large cuff (Patient not taking: Reported on 06/17/2024) 1 each 0    Blood Pressure Monitoring (BLOOD PRESSURE KIT) DEVI 1 kit by Does not apply route once a week. Check Blood Pressure regularly and record readings into the Babyscripts App.  Large Cuff.  DX O90.0 1 each 0    Ferric Maltol  (ACCRUFER ) 30 MG CAPS Take 1 capsule (30 mg total) by mouth 2 (two) times daily before a meal. Take 2 hrs before, or 2 hrs after a meal. 60 capsule 3    Prenatal Vit-Fe Fum-FA-Omega (PNV PRENATAL PLUS MULTIVIT+DHA) 27-1 & 312 MG MISC Take 1 tablet by mouth daily. 30 each 12      Review of Systems   All systems reviewed and negative except as stated in HPI  Blood pressure 129/62, pulse 77, resp. rate 16, height 5' 8 (1.727 m), weight 97.1 kg, last menstrual period 09/18/2023. General appearance: alert, cooperative, and no distress Lungs: clear to auscultation bilaterally Heart: regular rate and rhythm Abdomen: soft, non-tender; bowel sounds normal Pelvic: As below Extremities: Homans sign is negative, no sign of DVT Presentation: cephalic Fetal monitoringBaseline: 130 bpm, Variability: Good {> 6 bpm), Accelerations: Reactive, and Decelerations: Variable: mild Uterine activityFrequency: Every 2-6 minutes and Duration: 30-60 seconds Dilation: 3 Effacement (%): 90 Station: 0 Exam by:: Anette Arlean annah OBIE   Prenatal labs: ABO, Rh:  --/--/B POS (10/16 9045) Antibody: NEG (10/16 0954) Rubella: 6.00 (03/31 1112)  RPR: NON REACTIVE (08/28 1040)  HBsAg: Negative (03/31 1112)  HIV: Non Reactive (07/24 0821)  GBS: Positive/-- (09/18 1501)    Lab Results  Component Value Date   GBS Positive (A) 05/28/2024   GTT WNL Genetic screening  WNL Anatomy US  WNL  Immunization History  Administered Date(s) Administered   DTaP, 5 pertussis antigens 11/24/2003, 02/16/2004, 06/15/2004, 10/01/2005, 10/24/2007   Fluzone Influenza virus vaccine,trivalent (IIV3), split virus 10/01/2005, 11/23/2005, 10/24/2007, 07/12/2016, 10/12/2020, 06/19/2021   HIB (PRP-OMP) 11/24/2003, 02/16/2004, 06/15/2004, 10/01/2005   HPV 9-valent 05/27/2015, 07/12/2016   Hepatitis A, Ped/Adol-2 Dose 11/23/2005, 10/24/2007   Hepatitis B, PED/ADOLESCENT 15-Jul-2004, 10/27/2003, 06/15/2004   IPV 11/24/2003, 02/16/2004, 10/01/2005, 10/24/2007   MMRV 10/01/2005, 10/24/2007   Meningococcal B, Unspecified 10/12/2020, 06/19/2021   Meningococcal Conjugate 05/27/2015, 10/12/2020   Pneumococcal Conjugate PCV 7 11/24/2003, 02/16/2004, 06/15/2004, 10/01/2005   Tdap 05/27/2015, 04/16/2024   Varicella 10/01/2005, 10/24/2007    Prenatal Transfer Tool  Maternal Diabetes: No Genetic Screening: Normal Maternal Ultrasounds/Referrals: Normal Fetal Ultrasounds or other Referrals:  None Maternal Substance Abuse:  No Significant Maternal Medications:  None Significant Maternal Lab Results: Group B Strep positive Number of Prenatal Visits:greater than 3 verified prenatal visits Maternal Vaccinations:TDap Other Comments:  None   Results for orders placed or performed during the hospital encounter of 06/25/24 (from the past 24 hours)  CBC   Collection Time: 06/25/24  9:54 AM  Result Value Ref Range   WBC 7.2 4.0 - 10.5 K/uL   RBC 4.48 3.87 - 5.11 MIL/uL   Hemoglobin 12.1 12.0 - 15.0 g/dL   HCT 62.0 63.9 - 53.9 %   MCV 84.6 80.0 - 100.0 fL   MCH 27.0 26.0 - 34.0 pg    MCHC 31.9 30.0 - 36.0 g/dL   RDW 84.6 88.4 - 84.4 %   Platelets 248 150 - 400 K/uL   nRBC 0.0 0.0 - 0.2 %  Type and screen [facility]   Collection Time: 06/25/24  9:54 AM  Result Value Ref Range   ABO/RH(D) B POS    Antibody Screen NEG    Sample Expiration      06/28/2024,2359 Performed at [facility], [address] [place], [place] [address]     Patient Active Problem List   Diagnosis Date Noted   Hydradenitis 06/25/2024   Pregnancy 06/25/2024   Mother positive for group B Streptococcus colonization 06/01/2024   Anemia affecting pregnancy in third trimester 04/16/2024   Supervision of other normal pregnancy, antepartum 11/21/2023   Cannabis use disorder, mild, abuse 07/28/2023   GAD (generalized anxiety disorder) 07/27/2023   Substance induced mood disorder (HCC) 07/26/2023    Assessment/Plan:  Sherry Franklin is a 18 y.o. female G1P0000 with IUP at [redacted]w[redacted]d by LMP presenting for IOL secondary to elevated BP reading without diagnosis of gHTN or PreE (single elevated reading of 144/77 on 06/24/24).   #Labor: IOL for elevated BP without diagnosis of gHTN or PreE (single reading) #Pain: Epidural when ready #FWB: Cat 1 #GBS status:  Positive  #Elevated BP: without diagnosis of gHTN or preE - PIH labs - monitor BP  #Hx of Substance induced mood disorder #Hx of Cannabis use disorder - continued abstinence   #Generalized Anxiety Disorder - Consider PRN medication if needed (Atarax )  #Hidradenitis suppurativa - ordered topical clinda - vaseline gauze  - Abd pad beneath breasts  #Antepartum anemia: last CBC 10/16 Hgb 12.1 (WNL) - anticipate postpartum CBC followed by PO iron, IV iron, pRBC depending  on severity  #Feeding: Formula #Reproductive Life planning: Undecided but given ACOG patient handout for comparing types of contraception #Circ:  not applicable  Terrace Compton, MD  06/25/2024, 11:34 AM  Claris CHRISTELLA Cedar 06/25/2024, 9:07  AM

## 2024-06-25 NOTE — Discharge Summary (Signed)
 Postpartum Discharge Summary     Patient Name: Sherry Franklin DOB: 12-04-2005 MRN: [id number]  Date of admission: 06/25/2024 Delivery date:06/25/2024 Delivering provider: PAYNE, SHANTONETTE M Date of discharge: 06/27/2024  Admitting diagnosis: Pregnancy [Z34.90] Intrauterine pregnancy: [redacted]w[redacted]d     Secondary diagnosis:  Principal Problem:   Pregnancy Active Problems:   Hidradenitis suppurativa   Shoulder dystocia during labor and delivery, delivered   Gestational HTN  Additional problems:  Patient Active Problem List   Diagnosis Date Noted   Hidradenitis suppurativa 06/25/2024   Pregnancy 06/25/2024   Shoulder dystocia during labor and delivery, delivered 06/25/2024   Gestational HTN 06/25/2024   Mother positive for group B Streptococcus colonization 06/01/2024   Anemia affecting pregnancy in third trimester 04/16/2024   Supervision of other normal pregnancy, antepartum 11/21/2023   Cannabis use disorder, mild, abuse 07/28/2023   GAD (generalized anxiety disorder) 07/27/2023   Substance induced mood disorder (HCC) 07/26/2023       Discharge diagnosis: Term Pregnancy Delivered and Gestational Hypertension                                              Post partum procedures: Postpartum IUD placed Augmentation: Pitocin Complications: 30 second Shoulder Dystocia   Hospital course: Onset of Labor With Vaginal Delivery      18 y.o. yo G1P1001 at [redacted]w[redacted]d was admitted in Latent Labor  and Ruptured Membranes with light Mec stained fluid on 06/25/2024. Labor course was complicated by N/A  Membrane Rupture Time/Date: 9:20 AM,05/07/2024  Delivery Method:Vaginal, Spontaneous Operative Delivery:N/A Episiotomy: None Lacerations:  Labial;Periurethral;2nd degree Patient had an uncomplicated postpartum course.  BP control was obtained with Procardia XL 30 mg qd and she was started on Lasix  and K for 5 days. Otherwise, patient had a routine postpartum course. She is ambulating, tolerating a  regular diet, passing flatus, and urinating well. Patient is discharged home in stable condition on 06/27/2024, and will follow up in the office for BP check in one week.  Newborn Data: Birth date:06/25/2024 Birth time:5:27 PM Gender:Female Living status:Living Apgars:8 ,9  Weight:3590 g  Magnesium  Sulfate received: No BMZ received: No Rhophylac:N/A MMR:N/A Immune  T-DaP:Given prenatally Flu: No RSV Vaccine received: No Transfusion:No  Immunizations received: Immunization History  Administered Date(s) Administered   DTaP, 5 pertussis antigens 11/24/2003, 02/16/2004, 06/15/2004, 10/01/2005, 10/24/2007   Fluzone Influenza virus vaccine,trivalent (IIV3), split virus 10/01/2005, 11/23/2005, 10/24/2007, 07/12/2016, 10/12/2020, 06/19/2021   HIB (PRP-OMP) 11/24/2003, 02/16/2004, 06/15/2004, 10/01/2005   HPV 9-valent 05/27/2015, 07/12/2016   Hepatitis A, Ped/Adol-2 Dose 11/23/2005, 10/24/2007   Hepatitis B, PED/ADOLESCENT September 01, 2004, 10/27/2003, 06/15/2004   IPV 11/24/2003, 02/16/2004, 10/01/2005, 10/24/2007   MMRV 10/01/2005, 10/24/2007   Meningococcal B, Unspecified 10/12/2020, 06/19/2021   Meningococcal Conjugate 05/27/2015, 10/12/2020   Pneumococcal Conjugate PCV 7 11/24/2003, 02/16/2004, 06/15/2004, 10/01/2005   Tdap 05/27/2015, 04/16/2024   Varicella 10/01/2005, 10/24/2007    Physical exam  Vitals:   06/26/24 0626 06/26/24 1458 06/26/24 2049 06/27/24 0524  BP: 128/67 126/72 125/71 132/76  Pulse: 73 92 96 83  Resp: 18 18 20 20   Temp: (!) 97 F (36.1 C) 98.2 F (36.8 C) 98.1 F (36.7 C) 98.6 F (37 C)  TempSrc: Oral Oral Oral Oral  SpO2: 100% 99% 99%   Weight:      Height:       General: alert, cooperative, and no distress Lochia: appropriate Uterine Fundus:  firm Incision: N/A DVT Evaluation: No evidence of DVT seen on physical exam. Labs: Lab Results  Component Value Date   WBC 9.5 06/25/2024   HGB 10.3 (L) 06/25/2024   HCT 32.2 (L) 06/25/2024   MCV 85.0  06/25/2024   PLT 202 06/25/2024      Latest Ref Rng & Units 06/25/2024    3:34 PM  CMP  Glucose 70 - 99 mg/dL 82   BUN 6 - 20 mg/dL <5   Creatinine 9.55 - 1.00 mg/dL 9.38   Sodium 864 - 854 mmol/L 137   Potassium 3.5 - 5.1 mmol/L 3.5   Chloride 98 - 111 mmol/L 107   CO2 22 - 32 mmol/L 20   Calcium 8.9 - 10.3 mg/dL 8.4   Total Protein 6.5 - 8.1 g/dL 6.3   Total Bilirubin 0.0 - 1.2 mg/dL 0.7   Alkaline Phos 38 - 126 U/L 177   AST 15 - 41 U/L 15   ALT 0 - 44 U/L 9    Edinburgh Score:    06/26/2024    9:06 AM  Edinburgh Postnatal Depression Scale Screening Tool  I have been able to laugh and see the funny side of things. --   No data recorded  After visit meds:  Allergies as of 06/27/2024   No Known Allergies      Medication List     TAKE these medications    ACCRUFeR  30 MG Caps Generic drug: Ferric Maltol  Take 1 capsule (30 mg total) by mouth 2 (two) times daily before a meal. Take 2 hrs before, or 2 hrs after a meal.   acetaminophen  500 MG tablet Commonly known as: TYLENOL  Take 2 tablets (1,000 mg total) by mouth every 6 (six) hours as needed for mild pain (pain score 1-3), headache or fever (for pain scale < 4).   Blood Pressure Kit Devi 1 Device by Does not apply route once a week. Large cuff   Blood Pressure Kit Devi 1 kit by Does not apply route once a week. Check Blood Pressure regularly and record readings into the Babyscripts App.  Large Cuff.  DX O90.0   furosemide 20 MG tablet Commonly known as: LASIX Take 1 tablet (20 mg total) by mouth daily. Start taking on: June 28, 2024   ibuprofen 600 MG tablet Commonly known as: ADVIL Take 1 tablet (600 mg total) by mouth every 6 (six) hours as needed for moderate pain (pain score 4-6) or cramping.   NIFEdipine 30 MG 24 hr tablet Commonly known as: ADALAT CC Take 1 tablet (30 mg total) by mouth daily. Start taking on: June 28, 2024   PNV Prenatal Plus Multivit+DHA 27-1 & 312 MG Misc Take 1  tablet by mouth daily.   potassium chloride SA 20 MEQ tablet Commonly known as: KLOR-CON M Take 1 tablet (20 mEq total) by mouth daily.   senna-docusate 8.6-50 MG tablet Commonly known as: Senokot-S Take 2 tablets by mouth at bedtime as needed for mild constipation or moderate constipation.               Discharge Care Instructions  (From admission, onward)           Start     Ordered   06/27/24 0000  Discharge wound care:       Comments: As per discharge handout and nursing instructions   06/27/24 1235             Discharge home in stable condition Infant Feeding: Breast Infant  Disposition:home with mother Discharge instruction: per After Visit Summary and Postpartum booklet. Activity: Advance as tolerated. Pelvic rest for 6 weeks.  Diet: routine diet Future Appointments:No future appointments. Follow up Visit:   Please schedule this patient for a In person postpartum visit in 4 weeks with the following provider: Any provider. Additional Postpartum F/U:BP check 1 week  High risk pregnancy complicated by: HTN Delivery mode:  Vaginal, Spontaneous Anticipated Birth Control:  PP IUD placed   06/27/2024 Gloris Hugger, MD

## 2024-06-25 NOTE — Anesthesia Preprocedure Evaluation (Signed)
 Anesthesia Evaluation  Patient identified by MRN, date of birth, ID band Patient awake    Reviewed: Allergy & Precautions, H&P , NPO status , Patient's Chart, lab work & pertinent test results  Airway Mallampati: II  TM Distance: >3 FB Neck ROM: Full    Dental no notable dental hx.    Pulmonary neg pulmonary ROS   Pulmonary exam normal breath sounds clear to auscultation       Cardiovascular negative cardio ROS Normal cardiovascular exam Rhythm:Regular Rate:Normal     Neuro/Psych  PSYCHIATRIC DISORDERS Anxiety     negative neurological ROS  negative psych ROS   GI/Hepatic negative GI ROS, Neg liver ROS,,,  Endo/Other  negative endocrine ROS    Renal/GU negative Renal ROS  negative genitourinary   Musculoskeletal negative musculoskeletal ROS (+)    Abdominal   Peds negative pediatric ROS (+)  Hematology negative hematology ROS (+) Blood dyscrasia, anemia   Anesthesia Other Findings   Reproductive/Obstetrics negative OB ROS                              Anesthesia Physical Anesthesia Plan  ASA: 2  Anesthesia Plan: Epidural   Post-op Pain Management: Minimal or no pain anticipated   Induction: Intravenous  PONV Risk Score and Plan: 2  Airway Management Planned:   Additional Equipment: Fetal Monitoring  Intra-op Plan:   Post-operative Plan:   Informed Consent: I have reviewed the patients History and Physical, chart, labs and discussed the procedure including the risks, benefits and alternatives for the proposed anesthesia with the patient or authorized representative who has indicated his/her understanding and acceptance.     Dental Advisory Given  Plan Discussed with: Anesthesiologist  Anesthesia Plan Comments: (Labs checked- platelets confirmed with RN in room. Fetal heart tracing, per RN, reported to be stable enough for sitting procedure. Discussed epidural, and  patient consents to the procedure:  included risk of possible headache,backache, failed block, allergic reaction, and nerve injury. This patient was asked if she had any questions or concerns before the procedure started.)        Anesthesia Quick Evaluation

## 2024-06-25 NOTE — Progress Notes (Signed)
 Pt sat up on side of bed to attempt to go to restroom to empty bladder. Pt up on Stedy but remained at bedside d/t c/o nausea. Pt states It's no different than it has been offered Zofran , pt agreed. While pulling up Zofran  pt became dizzy and diaphoretic. Extra hands to room and pt placed back in bed. VSS, fluid bolus initiated. Pt feels better after laying back down. Will continue to monitor.

## 2024-06-25 NOTE — Procedures (Signed)
  Post-Placental IUD Insertion Procedure Note  Patient identified, informed consent signed prior to delivery, signed copy in chart, time out was performed.    Vaginal, labial and perineal areas thoroughly inspected for lacerations. Bilateral periurethral tears. 1st on maternal right 2nd  degree laceration on maternal left with labial extension. Repaired in traditional fashion prior to  Mirena placement.   - IUD grasped between sterile gloved fingers. Sterile lubrication applied to sterile gloved hand for ease of insertion. Fundus identified through abdominal wall using non-insertion hand. IUD inserted to fundus with bimanual technique. IUD carefully released at the fundus and insertion hand gently removed from vagina.    Strings trimmed to the level of the introitus. Patient tolerated procedure well.  Lot # [id number] Expiration Date Nov/2027   Patient given post procedure instructions and IUD care card with expiration date.  Patient is asked to keep IUD strings tucked in her vagina until her postpartum follow up visit in 4-6 weeks. Patient advised to abstain from sexual intercourse and pulling on strings before her follow-up visit. Patient verbalized an understanding of the plan of care and agrees.   Sherry Knope Erven) Emilio, MSN, CNM  [facility]  06/25/2024 6:14 PM

## 2024-06-25 NOTE — Progress Notes (Signed)
 Labor Progress Note Sherry Franklin is a 18 y.o. female G1P0000 with IUP at [redacted]w[redacted]d by LMP presenting for IOL secondary to elevated BP reading without diagnosis of gHTN or PreE (single elevated reading).   S:  Frederick is asleep at this time, epidural in place and appears to be working. Family at bedside also napping.   O:  BP 137/79   Pulse 89   Temp (!) 97.3 F (36.3 C) (Oral)   Resp 16   Ht 5' 8 (1.727 m)   Wt 97.1 kg   LMP 09/18/2023 (Exact Date)   SpO2 99%   BMI 32.54 kg/m   EFM: baseline 125 bpm/ good variability/ + accels/ variable decels  Toco/IUPC: 2-4 SVE: Dilation: 4 Effacement (%): 90 Cervical Position: Middle Station: -1 Presentation: Vertex Exam by:: Anette Arlean Annah OBIE Pitocin: 8 mu/min  A/P: 18 y.o. G1P0000 [redacted]w[redacted]d  1. Labor: IOL (on pit 8 currently) 2. FWB: Cat 2 3. Pain: Epidural 4. GBS: +   Anticipate SVD.  Terrace Compton, MD 3:51 PM

## 2024-06-26 MED ORDER — NIFEDIPINE ER OSMOTIC RELEASE 30 MG PO TB24
30.0000 mg | ORAL_TABLET | Freq: Every day | ORAL | Status: DC
  Administered 2024-06-26 – 2024-06-27 (×2): 30 mg via ORAL
  Filled 2024-06-26 (×2): qty 1

## 2024-06-26 MED ORDER — FUROSEMIDE 20 MG PO TABS
20.0000 mg | ORAL_TABLET | Freq: Every day | ORAL | Status: DC
  Administered 2024-06-27: 20 mg via ORAL
  Filled 2024-06-26: qty 1

## 2024-06-26 NOTE — Progress Notes (Signed)
 For the wounds that need to be dress BID (under both breasts & groin):  Under breasts: -clean with hipaclens, dry, apply clindamycin  gel.  Vaseline gauze, regular gauze. Secure with perforated tape.    Groin: clean, dry, apply clindamycin  gel. (No bandage).

## 2024-06-26 NOTE — Progress Notes (Signed)
 Post Partum Day 1  Subjective: Sherry Franklin is a 18 y.o. female G1 now P1 who delivered at [redacted]w[redacted]d for IOL secondary to elevated BP reading without diagnosis of gHTN or PreE (single elevated reading).  Sherry Franklin states she has no complaints, up ad lib, voiding, and tolerating PO. Reports she doesn't think she has passed flatus yet. Bonding with her baby, FOB at bedside as well. No concerns at this time.   Objective: Blood pressure 128/67, pulse 73, temperature (!) 97 F (36.1 C), temperature source Oral, resp. rate 18, height 5' 8 (1.727 m), weight 97.1 kg, last menstrual period 09/18/2023, SpO2 100%, unknown if currently breastfeeding.  Physical Exam:  General: alert, cooperative, and no distress Lochia: appropriate Uterine Fundus: firm Incision: N/A DVT Evaluation: No evidence of DVT seen on physical exam. Negative Homan's sign. No cords or calf tenderness. No significant calf/ankle edema.  Recent Labs    06/25/24 0954 06/25/24 1947  HGB 12.1 10.3*  HCT 37.9 32.2*    Assessment/Plan: Sherry Franklin is a 18 y.o. female G1 now P1 who delivered at [redacted]w[redacted]d for IOL secondary to elevated BP reading without diagnosis of gHTN or PreE (single elevated reading).   MOF: Formula  #PPD1 - meeting postpartum milestones - continue routine postpartum care  #PP HTN - currently only on Lasix - start on procardia 30 (last BP was 128/68, but has had continually elevated BP readings aside from this)  #Hydratenitis suppurative - continue topical clinda  Plan for discharge tomorrow.    LOS: 1 day   Terrace Compton, MD 06/26/2024, 9:23 AM

## 2024-06-26 NOTE — Patient Instructions (Signed)
 If interested in an outpatient lactation consult in office or virtually please reach out to us  at [facility] (First Floor) [address] [place] Please feel free to out with any lactation related questions or concerns [phone]  to leave a message for our lactation voicemail box.  Lactation support groups:  [facility], Tuesdays 10:00 am -12:00 pm at [address] on the second floor in the conference room, lactating parents and lap babies welcome.  [email or web address]  [email or web address]      Geraldina Louder, [facility]

## 2024-06-26 NOTE — Lactation Note (Signed)
 This note was copied from a baby's chart. Lactation Consultation Note  Patient Name: Sherry Franklin Unijb'd Date: 06/26/2024 Age:20 hours   Mother states to [facility] she is only formula feeding.  Consult Status Consult Status: Complete    Shannon Levorn Lemme 06/26/2024, 9:14 AM

## 2024-06-26 NOTE — Clinical Social Work Maternal (Signed)
 CLINICAL SOCIAL WORK MATERNAL/CHILD NOTE  Patient Details  Name: Sherry Franklin MRN: [id number] Date of Birth: [birth year]  Date:  06/26/2024  Clinical Social Worker Initiating Note:  Sherry Franklin Date/Time: Initiated:  06/26/24/1324     Child's Name:  Sherry Franklin   Biological Parents:  Mother, Father Sherry Franklin [birth year] Sherry Franklin 18 years old)   Need for Interpreter:  None   Reason for Referral:  Current Substance Use/Substance Use During Pregnancy  , Behavioral Health Concerns   Address:  [address] [place] [address]    Phone number:  [phone] (home)     Additional phone number:   Household Members/Support Persons (HM/SP):   Household Member/Support Person 1, Household Member/Support Person 2   HM/SP Name Relationship DOB or Age  HM/SP -1   MOB's mom    HM/SP -2   MOB's  brother    HM/SP -3        HM/SP -4        HM/SP -5        HM/SP -6        HM/SP -7        HM/SP -8          Natural Supports (not living in the home):  Extended Family, Spouse/significant other, Immediate Family   Professional Supports: None   Employment: Self-employed   Type of Work: Scientist, research (medical)   Education:  High school graduate   Homebound arranged:    Surveyor, quantity Resources:  Medicaid   Other Resources:  Sales executive  , WIC   Cultural/Religious Considerations Which May Impact Care:    Strengths:  Ability to meet basic needs  , Home prepared for child  , Pediatrician chosen   Psychotropic Medications:         Pediatrician:    [employer] operational officer area  Pediatrician List:   [facility] ([address] [employer])  [place]    [place]      Pediatrician Fax Number:    Risk Factors/Current Problems:  Substance Use  , Mental Health Concerns     Cognitive State:  Alert  , Able to Concentrate  , Linear Thinking  , Insightful  , Goal Oriented      Mood/Affect:  Calm  , Comfortable  , Interested  , Relaxed     CSW Assessment: CSW received a consult for THC use and hx of anxiety/depression. CSW met MOB at bedside to complete a full psychosocial assessment and offer support. CSW entered the room, introduced herself and acknowledged she had family present. CSW asked MOB for privacy reasons could her family step out for the assessment; MOB was agreeable and her guest stepped out. CSW explained her role and the reason for the visit. MOB presented as calm, was agreeable to consult and remained engaged throughout encounter.  CSW collected MOB's demographic information and inquired about mental health history. MOB reported being diagnosed with anxiety and depression in 2024. MOB reported she was living alone and doing the same thing everyday like working and being alone. MOB reported she reached a point where she was experiencing suicide ideations and was admitted to [facility]. MOB reported she completed her week stay and found the support she needed. MOB reported during the stay she was prescribed medication and participating in therapy. CSW asked MOB how has she coped with her mental health since. MOB  reported she has moved back in with her mom and brother and  prioritized self care and family time. MOB reported her mental health has been stable since the admission and declined therapy resources currently. CSW provided education regarding the baby blues period vs. perinatal mood disorders, discussed treatment and gave resources for mental health follow up if concerns arise.  CSW recommends self-evaluation during the postpartum time period using the New Mom Checklist from Postpartum Progress and encouraged MOB to contact a medical professional if symptoms are noted at any time.  CSW assessed for safety with MOB SI/HI/DV;MOB denied all.  CSW asked MOB does she receive support resources; MOB said yes(WIC).  MOB reported having all essential items for the infant  including a carseat, bassinet and crib for safe sleeping. CSW provided review of Sudden Infant Death Syndrome (SIDS) precautions.  CSW informed MOB due to [facility] during her pregnancy; the hospital will perform a UDS and CDS on the infant. If the screenings return with positive results a report to CPS will be made; MOB was understanding. MOB reported before the pregnancy she used THC on occasions. MOB reported once she found about her pregnancy she stopped and she did not use THC during her pregnancy.  CSW will continue to follow the CDS and complete a CPS report If warranted.   CSW Plan/Description:  No Further Intervention Required/No Barriers to Discharge, Sudden Infant Death Syndrome (SIDS) Education, Perinatal Mood and Anxiety Disorder (PMADs) Education, Hospital Drug Screen Policy Information, Other Information/Referral to [facility], CSW Will Continue to Monitor Umbilical Cord Tissue Drug Screen Results and Make Report if Sherry Sherry MARLA Joshua, LCSW 06/26/2024, 1:28 PM

## 2024-06-26 NOTE — Anesthesia Postprocedure Evaluation (Signed)
 Anesthesia Post Note  Patient: Sherry Franklin  Procedure(s) Performed: AN AD HOC LABOR EPIDURAL     Patient location during evaluation: Mother Baby Anesthesia Type: Epidural Level of consciousness: awake and alert Pain management: pain level controlled Vital Signs Assessment: post-procedure vital signs reviewed and stable Respiratory status: spontaneous breathing, nonlabored ventilation and respiratory function stable Cardiovascular status: stable Postop Assessment: no headache, no backache and epidural receding Anesthetic complications: no   No notable events documented.  Last Vitals:  Vitals:   06/25/24 2029 06/25/24 2205  BP: 138/76 (!) 144/77  Pulse: 80 79  Resp: 18 18  Temp: 36.7 C 36.7 C  SpO2: 100% 100%    Last Pain:  Vitals:   06/25/24 2205  TempSrc: Oral  PainSc: 0-No pain   Pain Goal: Patients Stated Pain Goal: 2 (06/25/24 1000)                 Emalie Mcwethy

## 2024-06-27 ENCOUNTER — Other Ambulatory Visit (HOSPITAL_COMMUNITY): Payer: Self-pay

## 2024-06-27 LAB — BIRTH TISSUE RECOVERY COLLECTION (PLACENTA DONATION)

## 2024-06-27 MED ORDER — POTASSIUM CHLORIDE CRYS ER 20 MEQ PO TBCR
20.0000 meq | EXTENDED_RELEASE_TABLET | Freq: Every day | ORAL | 0 refills | Status: AC
  Filled 2024-06-27: qty 5, 5d supply, fill #0

## 2024-06-27 MED ORDER — SENNOSIDES-DOCUSATE SODIUM 8.6-50 MG PO TABS
2.0000 | ORAL_TABLET | Freq: Every evening | ORAL | 2 refills | Status: AC | PRN
  Filled 2024-06-27: qty 30, 15d supply, fill #0

## 2024-06-27 MED ORDER — ACETAMINOPHEN 500 MG PO TABS
1000.0000 mg | ORAL_TABLET | Freq: Four times a day (QID) | ORAL | 2 refills | Status: AC | PRN
  Filled 2024-06-27: qty 60, 8d supply, fill #0

## 2024-06-27 MED ORDER — FUROSEMIDE 20 MG PO TABS
20.0000 mg | ORAL_TABLET | Freq: Every day | ORAL | 0 refills | Status: AC
  Filled 2024-06-27: qty 5, 5d supply, fill #0

## 2024-06-27 MED ORDER — IBUPROFEN 600 MG PO TABS
600.0000 mg | ORAL_TABLET | Freq: Four times a day (QID) | ORAL | 0 refills | Status: AC | PRN
  Filled 2024-06-27: qty 30, 8d supply, fill #0

## 2024-06-27 MED ORDER — NIFEDIPINE ER 30 MG PO TB24
30.0000 mg | ORAL_TABLET | Freq: Every day | ORAL | 0 refills | Status: AC
  Filled 2024-06-27: qty 30, 30d supply, fill #0

## 2024-07-07 ENCOUNTER — Telehealth (HOSPITAL_COMMUNITY): Payer: Self-pay

## 2024-07-07 NOTE — Telephone Encounter (Signed)
 07/07/2024 1947  Name: Sherry Franklin MRN: [id number] DOB: [birth year]  Reason for Call:  Transition of Care Hospital Discharge Call  Contact Status: Patient Contact Status: Unable to contact (mailbox is full)  Product manager needed:          Follow-Up Questions:    Van Postnatal Depression Scale:  In the Past 7 Days:    PHQ2-9 Depression Scale:     Discharge Follow-up:    Post-discharge interventions: NA  Signature  Rosaline Deretha PEAK
